# Patient Record
Sex: Female | Born: 1960 | Race: White | Hispanic: No | Marital: Married | Smoking: Never smoker
Health system: Southern US, Community
[De-identification: ages and names within clinical notes are randomized; demographics above are authoritative.]

## PROBLEM LIST (undated history)

## (undated) DIAGNOSIS — I82409 Acute embolism and thrombosis of unspecified deep veins of unspecified lower extremity: Secondary | ICD-10-CM

## (undated) DIAGNOSIS — M8580 Other specified disorders of bone density and structure, unspecified site: Secondary | ICD-10-CM

## (undated) DIAGNOSIS — Z8719 Personal history of other diseases of the digestive system: Secondary | ICD-10-CM

## (undated) DIAGNOSIS — E785 Hyperlipidemia, unspecified: Secondary | ICD-10-CM

## (undated) DIAGNOSIS — D219 Benign neoplasm of connective and other soft tissue, unspecified: Secondary | ICD-10-CM

## (undated) DIAGNOSIS — B977 Papillomavirus as the cause of diseases classified elsewhere: Secondary | ICD-10-CM

## (undated) HISTORY — DX: Hyperlipidemia, unspecified: E78.5

## (undated) HISTORY — DX: Gilbert syndrome: E80.4

## (undated) HISTORY — PX: ABDOMINAL SURGERY: SHX537

## (undated) HISTORY — DX: Benign neoplasm of connective and other soft tissue, unspecified: D21.9

## (undated) HISTORY — DX: Acute embolism and thrombosis of unspecified deep veins of unspecified lower extremity: I82.409

## (undated) HISTORY — DX: Personal history of other diseases of the digestive system: Z87.19

## (undated) HISTORY — DX: Other specified disorders of bone density and structure, unspecified site: M85.80

## (undated) HISTORY — PX: OTHER SURGICAL HISTORY: SHX169

## (undated) HISTORY — PX: ABDOMINAL HYSTERECTOMY: SHX81

## (undated) HISTORY — DX: Papillomavirus as the cause of diseases classified elsewhere: B97.7

---

## 1997-12-08 ENCOUNTER — Encounter (HOSPITAL_COMMUNITY): Admission: RE | Admit: 1997-12-08 | Discharge: 1998-03-08 | Payer: Self-pay | Admitting: Gynecology

## 1998-07-19 ENCOUNTER — Inpatient Hospital Stay (HOSPITAL_COMMUNITY): Admission: AD | Admit: 1998-07-19 | Discharge: 1998-07-23 | Payer: Self-pay | Admitting: Gynecology

## 1998-07-28 ENCOUNTER — Inpatient Hospital Stay (HOSPITAL_COMMUNITY): Admission: AD | Admit: 1998-07-28 | Discharge: 1998-07-28 | Payer: Self-pay | Admitting: Gynecology

## 1998-07-28 ENCOUNTER — Encounter: Payer: Self-pay | Admitting: Gynecology

## 1998-07-30 ENCOUNTER — Ambulatory Visit (HOSPITAL_COMMUNITY): Admission: RE | Admit: 1998-07-30 | Discharge: 1998-07-30 | Payer: Self-pay | Admitting: Gynecology

## 1998-11-23 ENCOUNTER — Ambulatory Visit: Admission: RE | Admit: 1998-11-23 | Discharge: 1998-11-23 | Payer: Self-pay | Admitting: Family Medicine

## 1999-07-30 ENCOUNTER — Other Ambulatory Visit: Admission: RE | Admit: 1999-07-30 | Discharge: 1999-07-30 | Payer: Self-pay | Admitting: Gynecology

## 2000-07-29 ENCOUNTER — Other Ambulatory Visit: Admission: RE | Admit: 2000-07-29 | Discharge: 2000-07-29 | Payer: Self-pay | Admitting: Gynecology

## 2001-05-07 ENCOUNTER — Encounter: Payer: Self-pay | Admitting: Gynecology

## 2001-05-07 ENCOUNTER — Ambulatory Visit (HOSPITAL_COMMUNITY): Admission: RE | Admit: 2001-05-07 | Discharge: 2001-05-07 | Payer: Self-pay | Admitting: Gynecology

## 2001-05-11 ENCOUNTER — Encounter: Payer: Self-pay | Admitting: Gynecology

## 2001-05-11 ENCOUNTER — Encounter: Admission: RE | Admit: 2001-05-11 | Discharge: 2001-05-11 | Payer: Self-pay | Admitting: Gynecology

## 2001-07-30 ENCOUNTER — Other Ambulatory Visit: Admission: RE | Admit: 2001-07-30 | Discharge: 2001-07-30 | Payer: Self-pay | Admitting: Gynecology

## 2001-12-04 ENCOUNTER — Ambulatory Visit (HOSPITAL_COMMUNITY): Admission: RE | Admit: 2001-12-04 | Discharge: 2001-12-04 | Payer: Self-pay | Admitting: Gastroenterology

## 2001-12-07 ENCOUNTER — Ambulatory Visit (HOSPITAL_COMMUNITY): Admission: RE | Admit: 2001-12-07 | Discharge: 2001-12-07 | Payer: Self-pay | Admitting: Gastroenterology

## 2001-12-07 ENCOUNTER — Encounter: Payer: Self-pay | Admitting: Gastroenterology

## 2001-12-18 ENCOUNTER — Encounter: Payer: Self-pay | Admitting: *Deleted

## 2001-12-18 ENCOUNTER — Encounter: Payer: Self-pay | Admitting: Emergency Medicine

## 2001-12-18 ENCOUNTER — Emergency Department (HOSPITAL_COMMUNITY): Admission: EM | Admit: 2001-12-18 | Discharge: 2001-12-18 | Payer: Self-pay | Admitting: Emergency Medicine

## 2002-06-18 ENCOUNTER — Encounter: Payer: Self-pay | Admitting: Gastroenterology

## 2002-06-18 ENCOUNTER — Ambulatory Visit (HOSPITAL_COMMUNITY): Admission: RE | Admit: 2002-06-18 | Discharge: 2002-06-18 | Payer: Self-pay | Admitting: Gastroenterology

## 2002-06-29 ENCOUNTER — Encounter: Payer: Self-pay | Admitting: Gynecology

## 2002-06-29 ENCOUNTER — Encounter: Admission: RE | Admit: 2002-06-29 | Discharge: 2002-06-29 | Payer: Self-pay | Admitting: Gynecology

## 2002-07-30 ENCOUNTER — Other Ambulatory Visit: Admission: RE | Admit: 2002-07-30 | Discharge: 2002-07-30 | Payer: Self-pay | Admitting: Gynecology

## 2003-03-04 ENCOUNTER — Ambulatory Visit (HOSPITAL_COMMUNITY): Admission: RE | Admit: 2003-03-04 | Discharge: 2003-03-04 | Payer: Self-pay | Admitting: Gastroenterology

## 2003-03-04 ENCOUNTER — Encounter: Payer: Self-pay | Admitting: Gastroenterology

## 2003-03-09 ENCOUNTER — Inpatient Hospital Stay (HOSPITAL_COMMUNITY): Admission: EM | Admit: 2003-03-09 | Discharge: 2003-03-16 | Payer: Self-pay | Admitting: Surgery

## 2003-03-09 ENCOUNTER — Encounter: Payer: Self-pay | Admitting: Surgery

## 2003-03-11 ENCOUNTER — Encounter: Payer: Self-pay | Admitting: Surgery

## 2003-03-15 ENCOUNTER — Encounter: Payer: Self-pay | Admitting: Surgery

## 2003-04-19 ENCOUNTER — Ambulatory Visit (HOSPITAL_COMMUNITY): Admission: RE | Admit: 2003-04-19 | Discharge: 2003-04-19 | Payer: Self-pay | Admitting: Surgery

## 2003-04-19 ENCOUNTER — Encounter: Payer: Self-pay | Admitting: Surgery

## 2003-05-06 ENCOUNTER — Inpatient Hospital Stay (HOSPITAL_COMMUNITY): Admission: RE | Admit: 2003-05-06 | Discharge: 2003-05-14 | Payer: Self-pay | Admitting: Surgery

## 2003-05-06 ENCOUNTER — Encounter (INDEPENDENT_AMBULATORY_CARE_PROVIDER_SITE_OTHER): Payer: Self-pay | Admitting: Specialist

## 2003-05-08 ENCOUNTER — Encounter: Payer: Self-pay | Admitting: Surgery

## 2003-08-29 ENCOUNTER — Other Ambulatory Visit: Admission: RE | Admit: 2003-08-29 | Discharge: 2003-08-29 | Payer: Self-pay | Admitting: Gynecology

## 2003-09-02 ENCOUNTER — Encounter: Admission: RE | Admit: 2003-09-02 | Discharge: 2003-09-02 | Payer: Self-pay | Admitting: Gynecology

## 2004-08-31 ENCOUNTER — Other Ambulatory Visit: Admission: RE | Admit: 2004-08-31 | Discharge: 2004-08-31 | Payer: Self-pay | Admitting: Gynecology

## 2004-09-07 ENCOUNTER — Encounter: Admission: RE | Admit: 2004-09-07 | Discharge: 2004-09-07 | Payer: Self-pay | Admitting: Gynecology

## 2005-02-08 ENCOUNTER — Ambulatory Visit (HOSPITAL_COMMUNITY): Admission: RE | Admit: 2005-02-08 | Discharge: 2005-02-08 | Payer: Self-pay | Admitting: Family Medicine

## 2005-09-06 ENCOUNTER — Other Ambulatory Visit: Admission: RE | Admit: 2005-09-06 | Discharge: 2005-09-06 | Payer: Self-pay | Admitting: Gynecology

## 2005-10-04 ENCOUNTER — Ambulatory Visit (HOSPITAL_COMMUNITY): Admission: RE | Admit: 2005-10-04 | Discharge: 2005-10-04 | Payer: Self-pay | Admitting: Gynecology

## 2006-11-19 ENCOUNTER — Ambulatory Visit (HOSPITAL_COMMUNITY): Admission: RE | Admit: 2006-11-19 | Discharge: 2006-11-19 | Payer: Self-pay | Admitting: Gynecology

## 2006-11-28 ENCOUNTER — Other Ambulatory Visit: Admission: RE | Admit: 2006-11-28 | Discharge: 2006-11-28 | Payer: Self-pay | Admitting: Gynecology

## 2006-11-29 DIAGNOSIS — B977 Papillomavirus as the cause of diseases classified elsewhere: Secondary | ICD-10-CM

## 2006-11-29 HISTORY — DX: Papillomavirus as the cause of diseases classified elsewhere: B97.7

## 2007-01-29 HISTORY — PX: OTHER SURGICAL HISTORY: SHX169

## 2007-02-20 ENCOUNTER — Inpatient Hospital Stay (HOSPITAL_COMMUNITY): Admission: RE | Admit: 2007-02-20 | Discharge: 2007-02-22 | Payer: Self-pay | Admitting: Gynecology

## 2007-02-20 ENCOUNTER — Encounter: Payer: Self-pay | Admitting: Gynecology

## 2007-07-24 ENCOUNTER — Other Ambulatory Visit: Admission: RE | Admit: 2007-07-24 | Discharge: 2007-07-24 | Payer: Self-pay | Admitting: Gynecology

## 2007-08-31 ENCOUNTER — Encounter: Admission: RE | Admit: 2007-08-31 | Discharge: 2007-08-31 | Payer: Self-pay | Admitting: Allergy and Immunology

## 2007-11-23 ENCOUNTER — Ambulatory Visit (HOSPITAL_COMMUNITY): Admission: RE | Admit: 2007-11-23 | Discharge: 2007-11-23 | Payer: Self-pay | Admitting: Gynecology

## 2007-12-04 ENCOUNTER — Other Ambulatory Visit: Admission: RE | Admit: 2007-12-04 | Discharge: 2007-12-04 | Payer: Self-pay | Admitting: Gynecology

## 2008-07-08 ENCOUNTER — Other Ambulatory Visit: Admission: RE | Admit: 2008-07-08 | Discharge: 2008-07-08 | Payer: Self-pay | Admitting: Gynecology

## 2008-07-08 ENCOUNTER — Encounter: Payer: Self-pay | Admitting: Women's Health

## 2008-07-08 ENCOUNTER — Ambulatory Visit: Payer: Self-pay | Admitting: Women's Health

## 2008-11-23 ENCOUNTER — Ambulatory Visit (HOSPITAL_COMMUNITY): Admission: RE | Admit: 2008-11-23 | Discharge: 2008-11-23 | Payer: Self-pay | Admitting: Gynecology

## 2008-12-09 ENCOUNTER — Ambulatory Visit: Payer: Self-pay | Admitting: Women's Health

## 2008-12-09 ENCOUNTER — Other Ambulatory Visit: Admission: RE | Admit: 2008-12-09 | Discharge: 2008-12-09 | Payer: Self-pay | Admitting: Gynecology

## 2008-12-09 ENCOUNTER — Encounter: Payer: Self-pay | Admitting: Women's Health

## 2009-08-28 ENCOUNTER — Ambulatory Visit: Payer: Self-pay | Admitting: Vascular Surgery

## 2009-08-28 ENCOUNTER — Encounter (INDEPENDENT_AMBULATORY_CARE_PROVIDER_SITE_OTHER): Payer: Self-pay | Admitting: Emergency Medicine

## 2009-08-28 ENCOUNTER — Emergency Department (HOSPITAL_COMMUNITY): Admission: EM | Admit: 2009-08-28 | Discharge: 2009-08-28 | Payer: Self-pay | Admitting: Emergency Medicine

## 2009-11-24 ENCOUNTER — Encounter: Admission: RE | Admit: 2009-11-24 | Discharge: 2009-11-24 | Payer: Self-pay | Admitting: Gynecology

## 2009-12-22 ENCOUNTER — Ambulatory Visit: Payer: Self-pay | Admitting: Women's Health

## 2009-12-22 ENCOUNTER — Other Ambulatory Visit: Admission: RE | Admit: 2009-12-22 | Discharge: 2009-12-22 | Payer: Self-pay | Admitting: Gynecology

## 2010-02-28 ENCOUNTER — Ambulatory Visit: Payer: Self-pay | Admitting: Women's Health

## 2010-06-01 ENCOUNTER — Ambulatory Visit: Payer: Self-pay | Admitting: Gynecology

## 2010-10-24 ENCOUNTER — Other Ambulatory Visit: Payer: Self-pay | Admitting: Gynecology

## 2010-10-24 DIAGNOSIS — Z1239 Encounter for other screening for malignant neoplasm of breast: Secondary | ICD-10-CM

## 2010-11-26 ENCOUNTER — Ambulatory Visit
Admission: RE | Admit: 2010-11-26 | Discharge: 2010-11-26 | Disposition: A | Discharge status: Home / Self Care | Payer: Managed Care, Other (non HMO) | Source: Ambulatory Visit | Attending: Gynecology | Admitting: Gynecology

## 2010-11-26 DIAGNOSIS — Z1239 Encounter for other screening for malignant neoplasm of breast: Secondary | ICD-10-CM

## 2010-11-28 ENCOUNTER — Ambulatory Visit (INDEPENDENT_AMBULATORY_CARE_PROVIDER_SITE_OTHER): Payer: Managed Care, Other (non HMO) | Admitting: Women's Health

## 2010-11-28 DIAGNOSIS — N390 Urinary tract infection, site not specified: Secondary | ICD-10-CM

## 2010-11-28 DIAGNOSIS — N898 Other specified noninflammatory disorders of vagina: Secondary | ICD-10-CM

## 2010-11-28 DIAGNOSIS — R82998 Other abnormal findings in urine: Secondary | ICD-10-CM

## 2010-11-28 DIAGNOSIS — B373 Candidiasis of vulva and vagina: Secondary | ICD-10-CM

## 2010-12-28 ENCOUNTER — Encounter: Payer: Self-pay | Admitting: Women's Health

## 2011-01-02 LAB — BASIC METABOLIC PANEL
CO2: 29 mEq/L (ref 19–32)
Calcium: 9.6 mg/dL (ref 8.4–10.5)
Creatinine, Ser: 0.74 mg/dL (ref 0.4–1.2)
GFR calc Af Amer: >60 mL/min (ref >60)
GFR calc non Af Amer: >60 mL/min (ref >60)

## 2011-01-02 LAB — D-DIMER, QUANTITATIVE: D-Dimer, Quant: <0.22 ug/mL-FEU (ref 0.00–0.48)

## 2011-01-14 ENCOUNTER — Other Ambulatory Visit: Payer: Self-pay | Admitting: Women's Health

## 2011-01-14 ENCOUNTER — Encounter (INDEPENDENT_AMBULATORY_CARE_PROVIDER_SITE_OTHER): Payer: Managed Care, Other (non HMO) | Admitting: Women's Health

## 2011-01-14 ENCOUNTER — Other Ambulatory Visit (HOSPITAL_COMMUNITY)
Admission: RE | Admit: 2011-01-14 | Discharge: 2011-01-14 | Disposition: A | Discharge status: Home / Self Care | Payer: Managed Care, Other (non HMO) | Source: Ambulatory Visit | Attending: Gynecology | Admitting: Gynecology

## 2011-01-14 DIAGNOSIS — B373 Candidiasis of vulva and vagina: Secondary | ICD-10-CM

## 2011-01-14 DIAGNOSIS — Z124 Encounter for screening for malignant neoplasm of cervix: Secondary | ICD-10-CM | POA: Insufficient documentation

## 2011-01-14 DIAGNOSIS — Z01419 Encounter for gynecological examination (general) (routine) without abnormal findings: Secondary | ICD-10-CM

## 2011-02-15 NOTE — Consult Note (Signed)
[facility]  Patient:    Kelsey [name], Kelsey [name] Visit Number: [id number] MRN: [id number]          Service Type: EMS Location: ED Attending Physician:  Benny Lennert Dictated by:   Sabino Gasser, M.D. Admit Date:  12/18/2001 Discharge Date: 12/18/2001   CC:         Anselmo Rod, M.D.  Douglass Rivers, M.D.  Tina L. Gatewood, P.A.   Consultation Report  HISTORY OF PRESENT ILLNESS:  Ms. Rodak is a 50 year old female patient of Dr. Loreta Ave, called me at 6 a.m. and was referred to the emergency room where I saw her.  She presents with acute onset of abdominal pain and nausea and vomiting.  She has had a number of episodes that started on January 17 and persisted until today.  The patient states that she was feeling well for the last few weeks when she states that last night she had pasta with red sauce and salad around 7 p.m. and around 45 minutes later she developed an upper abdominal discomfort, felt like a fullness, aching, which it usually starts out that way and then becomes a burning pain in the upper abdominal followed by a cramping-type of pain.  At approximately 1 a.m., she vomited up the salad material but not the pasta material and subsequently vomited three more times and each time it was bilious.  She denies any hematemesis or black vomitus. Her bowel movements have been normal once a day, formed brown stool.  No blood, no black stool seen.  In fact, her last bowel movement was last evening when in the midst of the vomiting episodes.  Vomiting does not seen to make her pain any better and she has had a number of episodes like this in the past.  Sometimes, the pain lasts for a few days or a week.  It may be just a discomfort but at times, when it gets worse, there is a burning feeling as well.  She has not been given any medications by Dr. Loreta Ave but has been given medications by Dr. Farrel Gobble, who is her gynecologist, but she did not bring  the medications with her and she is not sure what that was.  She states that prior to January she had never had any GI complaints.  She does not smoke, does not drink alcohol, and is taking no medications at this point, other than as described above, which she is not sure the name of the medications. Typically, these attacks occur usually in the evening.  She has had one episode to awake her from sleep at 6 a.m. and, with some of the episodes, she has had vomiting.  FAMILY HISTORY:  Notable for gallbladder disease in a number of the female relatives.  PAST MEDICAL HISTORY:  Notable for having had fibroids, fibroidectomy, cesarean section, and subsequently a hysterectomy--subtotal.  The ovaries have been left in place.  She denies abdominal distention, fever, chills, weight loss, blood per vomitus or per rectum, and denies history of cardiac/pulmonary disorders.  Apparently, the patient and her husband have debated whether in fact she had history of frequent urinary tract infections, but she denies dysuria or any urinary symptoms at this time.  Her hysterectomy was in 1999. Recent endoscopy and colonoscopy by Dr. Loreta Ave were negative.  Apparently, she had Hemoccult-positive stools in Dr. Trilby Drummer office.  Additionally, a HIDA scan was negative; however, the patient was not having abdominal pain at that time. 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 Loreta Ave.  PHYSICAL EXAMINATION:  VITAL SIGNS:  On examination now, she is afebrile.  Vital signs are stable.  GENERAL:  She is a well-developed, well-nourished female.  Appears actually in no distress, although at times describes having a cramping pain, for which she feels fairly uncomfortable.  HEENT:  Without evidence of jaundice.  NECK:  Thyroid is not enlarged.  No bruits are heard in the neck.  LUNGS:   Clear.  HEART:  Regular rhythm with pulses 60 without murmur, gallop, or rub appreciated.  ABDOMEN:  Bowel sounds are normal.  Tenderness is noted in the lower midline but nowhere else.  The discomfort, however, seems to be felt in the epigastrium above the umbilicus but tenderness is all below the umbilicus in the midline.  EXTREMITIES:  Normal.  NEUROLOGIC:  Normal.  RECTAL:  Exam deferred with recent colonoscopy done.  LABORATORY DATA:  At this time, laboratory studies were obtained.  Hematocrit at 46 may reflect some degree of dehydration.  Urine showed 0-2 white blood cells and trace ketones.  Amylase 122, lipase 199; both normal.  Bilirubin is 2.3.  Alkaline phosphatase and transaminases, total protein, albumin are all normal.  CBC:  White count 10,200, hemoglobin 16, hematocrit 46.5; as above, probably reflects some degree of dehydration from the vomiting.  Neutrophils are 90%, lymphocytes 6%.  Ultrasound showed that the gallbladder was well visualized without evidence of stones.  Gallbladder wall is normal.  The bowel duct is normal.  Liver, pancreas, and spleen are unremarkable.  Minimal amount of free fluid, however, surrounding the liver and the spleen and ultrasound evaluation of the pelvis reveals no definite fluid.  ASSESSMENT:  Abdominal pain in the lower midline, etiology of which is not clear at this point, despite evaluation, as described above.  PLAN:  CT scan of the abdomen and pelvis and decide on further course of therapy, depending on findings in the patients clinical course. Dictated by:   Sabino Gasser, M.D. Attending Physician:  Benny Lennert DD:  12/18/01 TD:  12/21/01 Job: [id number]

## 2011-02-15 NOTE — Procedures (Signed)
[facility]  Patient:    Kelsey [name], Kelsey [name] Visit Number: [id number] MRN: [id number]          Service Type: Attending:  Anselmo Rod, M.D. Dictated by:   Anselmo Rod, M.D. Proc. Date: 12/04/01   CC:         Douglass Rivers, M.D.  Tina L. Gatewood, P.A.   Procedure Report  DATE OF BIRTH:  1961/07/14.  REFERRING PHYSICIAN:  Douglass Rivers, M.D.  PROCEDURE PERFORMED:  Colonoscopy.  ENDOSCOPIST:  Anselmo Rod, M.D.  INSTRUMENT USED:  Olympus pediatric colonoscope.  INDICATIONS FOR PROCEDURE:  Guaiac positive stool in a 50 year old white female, rule out colonic polyps, masses, hemorrhoids, etc.  PREPROCEDURE PREPARATION:  Informed consent was procured from the patient. The patient was fasted for eight hours prior to the procedure and prepped with a bottle of magnesium citrate and a gallon of NuLytely the night prior to the procedure.  PREPROCEDURE PHYSICAL:  The patient had stable vital signs.  Neck supple. Chest clear to auscultation.  S1, S2 regular.  Abdomen soft with normal bowel sounds.  DESCRIPTION OF PROCEDURE:  The patient was placed in the left lateral decubitus position and sedated with an additional 50 mg of Demerol and 5 mg of Versed intravenously.  Once the patient was adequately sedated and maintained on low-flow oxygen and continuous cardiac monitoring, the Olympus video colonoscope was advanced from the rectum to the cecum without difficulty. Except for small internal hemorrhoids and a few left-sided diverticula, no other abnormalities were noticed.  The appendicular orifice and ileocecal valve were clearly visualized and photographed.  IMPRESSION:  Normal-appearing colon except for left-sided diverticulosis and small internal hemorrhoids.  No masses or polyps seen.  RECOMMENDATIONS: 1. A high fiber diet has been recommended for the patient with liberal fluid    intake. 2. Proceed with HIDA scan as  scheduled. 3. Outpatient follow-up in the next two weeks.  Repeat guaiacs will be done at    that time. Dictated by:   Anselmo Rod, M.D. Attending:  Anselmo Rod, M.D. DD:  12/04/01 TD:  12/07/01 Job: [id number]

## 2011-02-15 NOTE — Op Note (Signed)
NAMEBRITNAY, Kelsey [name]                          ACCOUNT NO.:  [id number]   MEDICAL RECORD NO.:  [id number]                   PATIENT TYPE:  INP   LOCATION:  0482                                 FACILITY:  [facility]   PHYSICIAN:  Abigail Miyamoto, M.D.              DATE OF BIRTH:  July 09, 1961   DATE OF PROCEDURE:  05/06/2003  DATE OF DISCHARGE:                                 OPERATIVE REPORT   PREOPERATIVE DIAGNOSIS:  Sigmoid diverticulitis.   POSTOPERATIVE DIAGNOSIS:  Sigmoid diverticulitis.   OPERATION/PROCEDURE:  1. Sigmoid colectomy.  2. Incidental appendectomy.   SURGEON:  Abigail Miyamoto, M.D.   ASSISTANT:  Lebron Conners, M.D.   ANESTHESIA:  General endotracheal anesthesia.   ESTIMATED BLOOD LOSS:  Minimal.   FINDINGS:  The patient was found to have a large cystic fluid collection  adjacent to the sigmoid colon which may represent a sterile abscess.   DESCRIPTION OF PROCEDURE:  The patient was brought to the operating room and  was identified as Kelsey [name].  She was placed supine on the operating  room table and general anesthesia was induced. Her abdomen was appropriately  draped in the usual sterile fashion.   Using a #10 blade, a lower midline incision was then created.  Incision was  carried down through the fascia with electrocautery.  The peritoneum was  then opened the entire length of the incision.  Several adhesions of the  small bowel to the abdominal wall were taken down with the electrocautery as  well as the Metzenbaum scissors.  The patient was found to have a floppy  peritoneum down the pelvis from her previous hysterectomy.  Further  examination revealed a large cystic filling mass down in the pelvis which  was felt to represent sterile abscess.  The sigmoid colon proximal to this  was decompressed.  Adherent sigmoid colon was identified proximal to this.   First our attention was turned to the appendectomy.  The mesoappendix was  taken down with  Kelly clamps 2-0 silk ties.  The mesoappendix was taken down  with Kelly clamps and 2-0 silk ties.  The appendix was then transected at  its base with a GIA-55 stapler.  The patient was found to have a large  fecalith at the base of the appendix.  The appendix was sent to pathology  for identification.   Next, the sigmoid colon was transected with GIA-55 stapler proximal to the  area of inflammation and cystic mass.  Finger fracturing was used to free  the cystic mass as well as mobilization along the white line of Toldt with  the electrocautery.  The colon mesentery was taken down likewise with clamps  and 2-0 silk ties as well, moving down to the pelvis around the cystic mass.  Further finger fracturing elevated the mass which was stuck to the sigmoid  colon up out of the pelvis.  An area of sigmoid colon  was then identified  distal to this and transected with the GIA-55 stapler as well.  The rest of  the mesentery was taken out with clamps and silk ties.  The cyst ruptured  somewhat and serosanguineous fluid was found in the cyst.  It did not have  any odor, did not have any appearance of purulence. The abdomen was then  irrigated with several liters of normal saline.   Next, a side-to-side anastomosis was performed from colon to colon by first  reapproximating the segments of proximal and distal colon with interrupted  silk sutures and then performing colotomies and finally performed the  anastomosis with a single firing of the GIA-55 stapler.  Large anastomosis  appeared to be created.  Silk suture was used to stop a bleeding point on  the suture line.  The open end was then closed with interrupted 3-0 silk  sutures.  Mesentery defect was likewise closed with silk sutures.  The  anastomosis was examined and found to be widely patent and appeared viable  and floppy.  The abdomen was again irrigated with several liters of normal  saline.  The pelvis was examined and there were several  areas of mild ooze  where the cystic mass had to be finger fractured up out of the pelvis.   Hemostasis was achieved with electrocautery as well as a piece of Surgicel.  Again the anastomosis was examined and was found to be intact and viable.  The midline fracture was then closed with a running #1 PDS suture.  The skin  was then irrigated and closed with skin staples.  The patient tolerated the  procedure well.  All sponge, needle and instrument counts were correct at  the end of the procedure.  The patient was then extubated in the operating  room and taken in stable condition to the recovery room.                                               Abigail Miyamoto, M.D.    DB/MEDQ  D:  05/06/2003  T:  05/06/2003  Job:  [id number]

## 2011-02-15 NOTE — Discharge Summary (Signed)
   NAMELISSETH, BRAZEAU                          ACCOUNT NO.:  [id number]   MEDICAL RECORD NO.:  [id number]                   PATIENT TYPE:  INP   LOCATION:  5730                                 FACILITY:  [facility]   PHYSICIAN:  Abigail Miyamoto, M.D.              DATE OF BIRTH:  June 17, 1961   DATE OF ADMISSION:  03/10/2003  DATE OF DISCHARGE:  03/16/2003                                 DISCHARGE SUMMARY   SUMMARY:  Kelsey [name] is 50 year old female with known diverticulitis who  has been failing outpatient oral antibiotic therapy.  Therefore the decision  was made to admit the patient for IV antibiotics.   HOSPITAL COURSE:  The patient was admitted and placed on IV Zosyn and a CAT  scan of the abdomen and pelvis was performed.  The CAT scan showed the  patient to have a pelvic abscess secondary to the diverticulitis.  At this point interventional radiology was consulted and the patient  underwent a transrectal abscess drainage after being transferred from [facility] to [facility].  The abscess was successfully drained and the  drain was left in place.  Postoperatively the patient's abdomen was soft.  Over the next several days she was continued on IV antibiotics with the  drain in place.  She was having normal bowel movements.  On March 15, 2003 a  CAT scan was repeated which shows she has the main abscess drainage but some  other smaller abscesses are present.  At this point the drain was removed  and she was continued on IV antibiotics.  By March 16, 2003 she was doing  well, she remained afebrile, she was tolerating a regular diet and her  abdomen was soft and nontender.  The decision was made to discharge the  patient to home on oral antibiotics.   DISCHARGE DIAGNOSIS:  Diverticulitis with abscess status post percutaneous  drainage.   MEDICATIONS:  1. Augmentin 375 mg b.i.d. for 10 days.  2. She will resume her home medications.   She has no dietary restrictions and  she may shower.  She will follow up in  my office in two weeks.                                               Abigail Miyamoto, M.D.    DB/MEDQ  D:  03/28/2003  T:  03/29/2003  Job:  [id number]   cc:   Anselmo Rod, M.D.  [address]  [address]  [place]  [place] [address]  Fax: [phone]    cc:   Anselmo Rod, M.D.  [address]  [address]  [place]  [place] [address]  Fax: [phone]

## 2011-02-15 NOTE — Discharge Summary (Signed)
Kelsey [name], Kelsey [name]                ACCOUNT NO.:  [id number]   MEDICAL RECORD NO.:  [id number]          PATIENT TYPE:  INP   LOCATION:  9309                          FACILITY:  [facility]   PHYSICIAN:  Juan H. Lily Peer, M.D.DATE OF BIRTH:  06-16-1961   DATE OF ADMISSION:  02/20/2007  DATE OF DISCHARGE:  02/22/2007                               DISCHARGE SUMMARY   HISTORY OF PRESENT ILLNESS:  The patient is a 50 year old gravida 10  para 1, abortus 9 who was taken to the operating room on the morning of  May 23 secondary to pelvic pain and left adnexal mass, right ovarian  cyst, and history of vaginal intraepithelial neoplasia I.   HOSPITAL COURSE:  With the assistance of Dr. Jamey Ripa, the patient  underwent an extensive abdominal pelvic adhesiolysis secondary to her  multitude of abdominal pelvic surgeries in the past.  She also underwent  a left salpingectomy as well as drainage of her right simple ovarian  cyst and the CO2 laser the vaginal cuff where she had VIN-I. The patient  had a history of DVT in the past so she received Lovenox 40 mg subcu 12  hours before the surgery and was continued on it postoperatively and for  30 days thereafter.  The patient also had PSA stockings as well for  prevention. She received a gram of cefoxitin IV and intraoperatively she  did well whereby she only had 50 mL blood loss. Postoperatively she had  an On-Q catheter  underneath her incision site for postoperative  analgesia consisting of 0.5% lidocaine at 4 mL/hour which help the  patient become more ambulatory much quicker and require less of the PCA  pump.  In a 24-hour period she used 1.4 mg of Dilaudid and did not  require mitral oral pain medication. She was afebrile.  Her  hemoglobin/hematocrit 11.9 and 33.9 respectively with a platelet count  125,000 which was expected to be a transitory low secondary to her  Lovenox.  The patient was up ambulating, was placed on clear liquid diet  and the evening  of her postoperative day starting her second day she  felt a little lightheaded and her blood pressure dropped to 78/46 with  pulse of 57.  She was given a fluid bolus of 500 mL of lactated Ringer's  and kept 125 mL/hour and no recurrence in her blood pressure was back to  normal at 110/64 with pulse of 73. Second postoperative day she was up  and ambulating now was passing gas. Abdomen was soft, positive bowel  sounds.  Incision was intact.  Slight drainage clear fluid from the  catheter site otherwise doing well.  She was up and ambulating and she  was advanced to a regular diet.  She was continued on her Lovenox 40 mg  subcu every 24 hours.  She was ready to be discharged home.   FINAL DIAGNOSIS:  1. Pelvic pain.  2. Abdominal pelvic adhesions.  3. Left hydrosalpinx.  4. VIN-I.  5. Right ovarian cyst.   PROCEDURE PERFORMED:  1. Exploratory laparotomy.  2. Extensive abdominal pelvic adhesiolysis.  3. Left salpingectomy.  4. Drainage of right simple ovarian cyst.  5. CO2 laser vagina.   FINAL DISPOSITION/FOLLOW UP:  The patient was ready to be discharged  home after second postoperative day. DVT prophylaxis had been undertaken  to the fact that the patient had a history of DVT in the past.  She  received Lovenox 40 mg subcu at 12 hours before surgery and was  continued q.24 h postoperatively and will continue to do so at home for  four more weeks.  The patient also had PSA stockings during her  hospitalization as well for DVT prophylaxis.  The patient will return to  the office next week after week after surgery to remove her midline  staples.  She will self remove her On-Q pain medication catheter  tomorrow after 72 hours and she was given a prescription of Lortab  7.5/500 take one p.o. q.4-6 h p.r.n. pain.      Juan H. Lily Peer, M.D.  Electronically Signed     JHF/MEDQ  D:  02/22/2007  T:  02/22/2007  Job:  [id number]

## 2011-02-15 NOTE — Discharge Summary (Signed)
NAMEESPERANSA, [name]                          ACCOUNT NO.:  [id number]   MEDICAL RECORD NO.:  [id number]                   PATIENT TYPE:  INP   LOCATION:  0482                                 [facility]ACILITY[facility]  [facility]   PHYSICIAN:  Abigail Miyamoto, M.D.              DATE OF BIRTH:  10/13/1960   DATE OF ADMISSION:  05/06/2003  DATE OF DISCHARGE:  05/14/2003                                 DISCHARGE SUMMARY   DISCHARGE DIAGNOSES:  1. Diverticulitis of the colon with abscess, status post elective sigmoid     colon resection.  2. Benign tumor of the ovary.  3. Nausea.   SUMMARY OF HISTORY:  Kelsey [name] is a 50 year old female with recent  history of a diverticular abscess who now presents for elective sigmoid  colectomy and appendectomy.   HOSPITAL COURSE:  The patient was admitted on May 06, 2003 where she was  taken to the operating room and underwent a sigmoid colectomy and  appendectomy.  She was found to have a large mass and cystic fluid  collection base of the colon which was felt to be a sterile abscess and  this, indeed, ended up containing her left ovary.  She tolerated procedure  well and was taken in stable condition to a regular surgical floor.  Postoperative day one she did have some nausea and vomited once during the  night, but otherwise was comfortable.  She began ambulating on postoperative  day one.  By postoperative day two was feeling somewhat better.  She did  have some shoulder pain which ended up being unremarkable.  A chest x-ray  was unremarkable at this time as well as an EKG.  Her Foley catheter was  removed and by postoperative day three she was having no nausea and was  tolerating sips of liquids.  She had good bowel sounds.  Her incision was  healing well.  She was continued on IV antibiotics at this time.  By  postoperative day four she was continuing to do well.  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  Reglan was added for nausea.  By  postoperative day five she was having some flatus and having better bowel  sounds.  She was kept on liquids, however.  By the next postoperative day  she was having multiple loose bowel movements.  Her diet was advanced  further.  Continued to remain soft.  Over the next two days she continued to  improve greatly.  Her staples were removed.  She began tolerating a regular  diet and having normal bowel movements.  Decision was made to discharge  patient to home.   DISCHARGE MEDICATIONS:  She will take Percocet and ibuprofen for pain.  She  will resume any home medications she was on.   ACTIVITY:  She is to do no heavy lifting greater than 20  pounds for five  weeks.   DIET:  She has no restrictions.   WOUND CARE:  She may shower.  She will leave her Steri-Strips in place.   FOLLOWUP:  She will follow up in the office of Dr. __________ in one to two  weeks.                                               Abigail Miyamoto, M.D.   DB/MEDQ  D:  06/02/2003  T:  06/02/2003  Job:  [id number]

## 2011-02-15 NOTE — H&P (Signed)
Kelsey [name], [name]               ACCOUNT NO.:  [id number]   MEDICAL RECORD NO.:  [id number]          PATIENT TYPE:  INP   LOCATION:                                FACILITY:  [facility]   PHYSICIAN:  Juan H. Lily Peer, M.D.DATE OF BIRTH:  1961/07/12   DATE OF ADMISSION:  02/20/2007  DATE OF DISCHARGE:  02/22/2007                              HISTORY & PHYSICAL   CHIEF COMPLAINT:  1. Pelvic pain.  2. Right ovarian cyst.  3. Left adnexal mass.  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   HISTORY:  The patient is a gravida 10, para 1, AB 9 who was seen in the  office for a preoperative consultation on Feb 17, 2007.  She is  scheduled to undergo an exploratory laparotomy with lysis of  abdominopelvic adhesions as well as right ovarian cystectomy and left  salpingectomy with possible left salpingo-oophorectomy.  The patient had  been complaining of lower abdominal discomfort especially when she  twists or turns and had an ultrasound on January 26, 2007 in the office  which was compared with a previous scan dated December 04, 2006 which  demonstrated that the patient has a right ovary with a thin-walled cyst  that measured 37 x 31 x 19 mm but also had some solid echogenic focus in  the superior wall of the cyst, and it was measuring 8 x 7 mm.  It was  not seen on the previous scan, although positive color flow Doppler  documented the lateral edge of this mass.  The ovary was not seen, but a  tortuous left adnexa, thin-walled cystic area measuring 6.9 x 3.1 x 1.5  cm with negative color flow Doppler, and tiny excrescences in the area  of the wall of the mass were noted, possibly a hydrosalpinx.  The  patient has had a history in the past of carcinoma in situ of the cervix  and had a hysterectomy in the past.  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   Interestingly, her past history consists of a multitude of operations.  She had had a myomectomy in the past as well as a C-section and also had  a TAH for dysfunctional uterine bleeding.  That operation resulted in an  incidental enterotomy which required repair back in 1994.  She had had  history of sigmoid colectomy in 2004 secondary to a diverticular abscess  and had an appendectomy at the same time.  It is also reported that she  also had a left ovarian cystadenoma removed as well.  Also at the time  of one of her multitude of D&Cs in the past, she had developed deep  venous thrombosis, and a workup demonstrated that she was positive for  the anticardiolipin antibodies.  Also in the past she had been diagnosed  with Sullivan Lone syndrome.   Review of the patient's medical record indicated from her previous  surgery that she had extensive abdominopelvic adhesions, and the  ovaries  had been adherent to the pelvic sidewall.  With all of the above  multitude of operations that the patient has undergone, it was decided  to consult a general surgeon, and Dr. Jamey Ripa will be present during the  exploratory laparotomy until we gain entrance safely into the abdominal  cavity before we proceed then with the gynecological plan of action  which would be to remove the right tube and an ovarian cystectomy and  possible removal of the left tube and ovary.   ALLERGIES:  THE PATIENT DENIES ANY ALLERGIES.   PAST MEDICAL HISTORY:  1. Multiple D&Cs.  2. Myomectomy in the past.  3. Cesarean section.  4. Total abdominal hysterectomy with incidental enterotomy.  5. Resection of a portion of the sigmoid colon as a result of ruptured      diverticular abscess in 2004.  6. History of anticardiolipin antibodies.  7. Sullivan Lone syndrome.   FAMILY HISTORY:  Maternal grandmother with diabetes.  Maternal aunt with  history of breast cancer.   PHYSICAL EXAMINATION:  VITAL SIGNS:  The patient weighs 136 pounds.   She  is 5 feet 2.5 inches tall.  Blood pressure 122/80.  HEENT:  Unremarkable.  NECK:  Supple.  Trachea midline.  No carotid bruits, no thyromegaly.  LUNGS:  Clear to auscultation without rhonchi or wheezes.  HEART:  Regular rate and rhythm.  No murmurs.  BREASTS:  Exam not done.  ABDOMEN:  Soft, nontender.  No rebound or guarding.  PELVIC:  Bartholin, urethral, and Skene's glands within normal limits.  Vaginal with no gross visual lesions noted but on colposcopic-directed  biopsy, the patient was noted to have VAIN 1 at the center aspect of the  vaginal cuff.  RECTAL:  Unremarkable.   ASSESSMENT:  50 year old gravida 10, para 1, abortion 9 with chronic  pelvic pain with a tortuous 7-cm left adnexal mass, possibly  hydrosalpinx and a 3.5-cm right ovarian cyst.  The patient will undergo  exploratory laparotomy due to the fact that she has had a multitude of  abdominopelvic surgeries, see note above.  I will plan on proceeding  with a left salpingectomy, possible left salpingo-oophorectomy, with  possible right ovarian cystectomy.  Because of the extensive nature of  all of the operations and complications that she had in the past, a  general surgeon will be present during the exploratory process in the  operating room before we proceed with the gynecological portion of the  operation.  We will make every effort to preserve as much ovarian tissue  as we had discussed in the preoperative consultation.  Due to her  history of deep venous thrombosis in the past, she will receive Lovenox  two hours before her procedure and then daily for one to two weeks  postoperatively.  She will also undergo a bowel prep the day before her  surgery.  After the abdominal portion is completed, we will proceed with  CO2 laser ablation of the vaginal cuff and vaginal Intra-epithelial  neoplasia 1 at the center aspect of the vaginal cuff that was described in the findings on colposcopic-directed biopsy.  We  went through a  detailed discussion about the potential risks of the operation to  include infection, bleeding, trauma to internal organs, and the need for  diverting colostomy, possible additional bowel resection, risk of injury  to other internal organs, and also potential risks from the laser  surgery as well.  I discussed the case of her Sullivan Lone syndrome with Dr.  Charna Elizabeth because of the patient's slight elevation of her  preoperative labs which indicated that her total bilirubin was 2.0,  upper limits of normal being 1.2.  At the time of this dictation, we are  waiting to see if most of the bilirubin is unconjugated, then the  patient should have no problem proceeding with the surgery according to  the gastroenterologist.  All of these issues were discussed with the  patient, and all questions were answered.  We will follow accordingly.   PLAN:  The patient is scheduled for exploratory laparotomy with lysis of  abdominopelvic adhesions along with right ovarian cystectomy and left  salpingectomy with possible left salpingo-oophorectomy on Friday, Feb 20, 2007 at 7:30 a.m. at [facility].  Please have history and  physical available.      Juan H. Lily Peer, M.D.  Electronically Signed     JHF/MEDQ  D:  02/19/2007  T:  02/19/2007  Job:  [id number]

## 2011-02-15 NOTE — H&P (Signed)
NAMEGINNI, Kelsey [name]                          ACCOUNT NO.:  [id number]   MEDICAL RECORD NO.:  [id number]                   PATIENT TYPE:  INP   LOCATION:  5730                                 FACILITY:  [facility]   PHYSICIAN:  Abigail Miyamoto, M.D.              DATE OF BIRTH:  1960-12-01   DATE OF ADMISSION:  03/09/2003  DATE OF DISCHARGE:  03/16/2003                                HISTORY & PHYSICAL   CHIEF COMPLAINT:  Abdominal pain.   HISTORY:  This is a 50 year old female who has a long history of abdominal  pain.  She most recently had an acute episode of crampy abdominal pain and  fever so she presented to gastroenterologist who performed a CATs scan  showing her to have some diverticulitis.  She was placed on oral antibiotics  several days ago, but secondary to her failure to improve she was sent to my  office for further evaluation.   Upon evaluation in the office her abdomen was found to be soft and there was  tenderness and bloating in the left lower quadrant. Therefore, decision was  made to admit the patient for IV antibiotics. The patient was otherwise  without complaint.  She reports she was having some mild constipation, but  no real fevers although she is having some night sweats.  She was otherwise  without complaints.   PAST MEDICAL HISTORY:  Significant for the sigmoid diverticulosis. She has  had a long medical workup of her abdominal problems.   PAST SURGICAL HISTORY:  Includes a total abdominal hysterectomy and  fibroidectomy.   MEDICATIONS AT THIS TIME:  Cipro and Flagyl.   SOCIAL HISTORY:  She is married.  She has 1 adopted child. 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   REVIEW OF SYSTEMS:  Otherwise unremarkable.   ALLERGIES:  She has no known drug allergies.   PHYSICAL EXAMINATION:  GENERAL:  Reveals a well-developed, well-nourished  female in no acute distress.  Generally she is well appearing.  VITAL SIGNS:  Temperature is 98.7, pulse is 81, blood pressure is 106/62,  respiratory rate is 20.  HEENT:  Eyes she is anicteric.  Her pupils are reactive.  NECK:  Neck is supple.  There are no masses.  There is no thyromegaly.  LUNGS:  Clear to auscultation bilaterally with normal effort.  CARDIOVASCULAR:  Regular rate and rhythm but no peripheral edema.  ABDOMEN:  Soft.  There is no organomegaly.  There are no hernias. The  patient does have tenderness and fullness in the left lower quadrant.  EXTREMITIES:  Warm and well perfused.    ASSESSMENT/PLAN:  This is a 50 year old female with diverticulitis who  appears to not be improving with oral outpatient antibiotics.  At this point  we will start her on IV antibiotics. We need a CAT scan of the abdomen and  pelvis to rule  out a diverticular abscess.                                               Abigail Miyamoto, M.D.    DB/MEDQ  D:  03/28/2003  T:  03/28/2003  Job:  [id number]

## 2011-02-15 NOTE — Op Note (Signed)
[facility]  Patient:    Kelsey [name], Kelsey [name] Visit Number: [id number] MRN: [id number]          Service Type: Attending:  Anselmo Rod, M.D. Dictated by:   Anselmo Rod, M.D. Proc. Date: 12/04/01   CC:         Douglass Rivers, M.D.  Tina L. Gatewood, P.A.   Operative Report  DATE OF BIRTH:  May 27, 1961  REFERRING PHYSICIAN:  Douglass Rivers, M.D.  PROCEDURE PERFORMED:  Esophagogastroduodenoscopy.  ENDOSCOPIST:  Anselmo Rod, M.D.  INSTRUMENT USED:  Olympus video panendoscope.  INDICATIONS FOR PROCEDURE:  Epigastric pain and guaiac positive stool in a 50 year old white female rule out peptic ulcer disease, esophagitis, gastritis, etc.  PREPROCEDURE PREPARATION:  Informed consent was procured from the patient. The patient was fasted for eight hours prior to the procedure.  PREPROCEDURE PHYSICAL:  The patient had stable vital signs.  Neck supple. Chest clear to auscultation.  S1, S2 regular.  Abdomen soft with normal abdominal bowel sounds.  DESCRIPTION OF PROCEDURE:  The patient was placed in left lateral decubitus position and sedated with 50 mg of Demerol and 5 mg of Versed intravenously. Once the patient was adequately sedated and maintained on low-flow oxygen and continuous cardiac monitoring, the Olympus video panendoscope was advanced through the mouthpiece, over the tongue, into the esophagus under direct vision.  The entire esophagus appeared normal.  The entire gastric mucosa and the proximal small bowel appeared normal as well.  No erosions, ulcerations, masses or polyps were seen.  There was no evidence of outlet obstruction.  IMPRESSION:  Normal esophagogastroduodenoscopy.  RECOMMENDATION:  Proceed with colonoscopy at this time.  Further recommendations to be made thereafter. Dictated by:   Anselmo Rod, M.D. Attending:  Anselmo Rod, M.D. DD:  12/04/01 TD:  12/07/01 Job: [id number]

## 2011-02-15 NOTE — Op Note (Signed)
Kelsey [name], Kelsey [name]                ACCOUNT NO.:  [id number]   MEDICAL RECORD NO.:  [id number]          PATIENT TYPE:  INP   LOCATION:  9309                          FACILITY:  [facility]   PHYSICIAN:  Juan H. Lily Peer, M.D.DATE OF BIRTH:  1961-06-02   DATE OF PROCEDURE:  02/20/2007  DATE OF DISCHARGE:                               OPERATIVE REPORT   SURGEON:  Juan H. Lily Peer, M.D.   CO-SURGEON:  Cicero Duck, M.D.   INDICATIONS FOR OPERATION:  A 50 year old gravida 10, para 1, AB 9 who  was taken to the operating room for an exploratory laparotomy with  planned abdominal and pelvic adhesions as well as right ovarian  cystectomy and possible left salpingectomy, possible left salpingo-  oophorectomy, secondary to low abdominal pains and left adnexal cystic  mass measuring 6.9 x 3.1 x 1.5 cm and a right thin-wall cyst measuring  3.7 x 3.1 x 1.9 cm.  Patient with prior history of DVT in the past, had  received Lovenox 30 mg subcutaneously 12 hours before surgery.   PREOPERATIVE DIAGNOSES:  Cyst.   POSTOPERATIVE DIAGNOSIS:  Cyst, along with extensive abdominal and  pelvic adhesions.   PROCEDURE PERFORMED:  1. Abdominal and pelvic adhesiolysis.  2. Left salpingectomy.  3. Drainage of right simple ovarian cyst.   FINDINGS:  Patient with extensive abdominal and pelvic adhesions and a  sausage-shaped mass that appeared to be a hydrosalpinx measuring  approximately 6 x 3 x 1.1 cm on the left adnexa, and the right ovary had  one small cyst measuring 3.7 x 3.1 x 1.9 cm, clear in appearance also.  Patient with colposcopic directed biopsy had demonstrated she had VIN-1  of the vaginal cuff.   PREOPERATIVE DIAGNOSES:  1. Pelvic pain.  2. Left adnexal mass.  3. Right ovarian cyst.  4. VIN-1.   POSTOPERATIVE DIAGNOSES:  1. Pelvic pain.  2. Left adnexal mass.  3. Right ovarian cyst.  4. VIN-1.   PROCEDURE PERFORMED:  1. Extensive abdominal and pelvic adhesiolysis.  2. Left  salpingectomy.  3. Drainage of right simple ovarian cyst.  4. CO2 laser of vaginal dysplasia.   FINDINGS:  Patient with extensive abdominal and pelvic adhesions and a  hydrosalpinx equivalent to the measurements given above and a very small  right ovarian cyst.  There was no excrescences on either ovary.   DESCRIPTION OF OPERATION:  After the patient was adequately counseled,  she was taken to the operating room.  Prior to this, the patient had  received Lovenox 40 mg subcu 12 hours before her surgery, and in the  hospital, she received a gram of cefoxitin and had PSA stockings before  she was taken to the operating room.  In the operative room, she  underwent a successful general endotracheal anesthesia.  The abdomen,  vagina and perineum were prepped and draped in the usual sterile  fashion.  A Foley catheter had been inserted to monitor urinary output.  A midline incision was made adjacent to the previous midline scar, and  the incision was carried down from the skin,  subcutaneous tissue, down  to the rectus fascia.  Covers were placed on the edge of the fascia, and  meticulous dissection was required in an effort to enter the peritoneal  cavity without incurring any injury to the bowel.  It was noted that  small bowel was adhered to different segments of the anterior peritoneal  surface which was meticulously dissected as well as multiple loops of  small bowel that required extensive adhesiolysis, especially down in the  pelvis and left pelvic sidewall in an effort to free the area to  visualize the pelvis.  The patient had been placed in Trendelenburg  position, and O'Connor-O'Sullivan retractors were in place.  After  meticulous dissection, the left ovary was identified and appeared to be  normal but had a hydrosalpinx which was meticulously dissected and freed  with the use of the Bovie cautery and passed off the operative field.  The right ovary had two small clear cysts that  were drained with the  Bovie.  The pelvic cavity was then copiously irrigated with normal  saline solution.  After ascertaining adequate hemostasis, closure was  started.  After the bowel was placed in its position, Seprafilm adhesion  prevention strip was placed overlying the small bowel.  The fascia was  approximated incorporating at times part of the peritoneum and  interrupted suture with 0 Vicryl suture.  The subcutaneous bleeders were  Bovie cauterized.  The OnQ for postoperative analgesia was placed  suprafascially because there was not enough peritoneum to place it  between the peritoneum and fascia, and one catheter was placed through  the skin and above the fascia and laid subcutaneously.  The subcutaneous  tissue was then reapproximated with a with a running stitch of 3-0  Vicryl suture, and the skin was reapproximated with staples.  The OnQ  was flushed with 10 mL of 0.25% Marcaine.  The patient will receive the  OnQ 4 mL per hour of 1% lidocaine for postoperative analgesia.  After  the dressings were in place, the legs were placed on the stirrups in a  high lithotomy position, and the titanium-coated speculum was placed  into the vaginal vault, and the colposcope was brought into view.  The  center position of the vaginal cuff was identified, and the CO2 laser  was then connected to the colposcope, and the CO2 was laser was set at 6  watts, and in a brush-like fashion, the vaginal cuff was lysed  approximately 2-3 mm.  Following this, Estrace vaginal cream was placed  into the vagina.  The patient was then extubated and transferred to the  recovery with stable vital signs.  Blood loss was minimal.  Fluid  resuscitation consisted of 3 liters of LR, and urine output was 150 mL  and clear.      Juan H. Lily Peer, M.D.  Electronically Signed     JHF/MEDQ  D:  02/20/2007  T:  02/20/2007  Job:  [id number]

## 2011-02-15 NOTE — H&P (Signed)
Kelsey [name], Kelsey [name]                          ACCOUNT NO.:  [id number]   MEDICAL RECORD NO.:  [id number]                   PATIENT TYPE:  INP   LOCATION:  0482                                 [facility]ACILITY:  [facility]   PHYSICIAN:  Abigail Miyamoto, M.D.              DATE OF BIRTH:  [birth year]   DATE OF ADMISSION:  05/06/2003  DATE OF DISCHARGE:  05/14/2003                                HISTORY & PHYSICAL   CHIEF COMPLAINT:  Abdominal pain.   HISTORY OF PRESENT ILLNESS:  This is a very pleasant 50 year old female who  is being admitted to the hospital for elective sigmoid colectomy.  She  recently had been treated for diverticulitis with oral antibiotics and after  failure to improve she underwent a CAT scan of the abdomen and pelvis that  showed her to have a large pelvic abscess.  At this point she was admitted  to the hospital and placed on IV antibiotics and under percutaneous drainage  her abscess collection was drained.  Over several days in the hospital she  improved and her drainage catheter could be removed.  She now presents for  elective hemicolectomy.  A barium enema has shown no other abnormalities in  the colon.   PAST MEDICAL HISTORY:  Significant for having had multiple miscarriages.   PAST SURGICAL HISTORY:  Total abdominal hysterectomy.   SOCIAL HISTORY:  She does not smoke.  Does not drink alcohol.   MEDICATIONS:  1. Cipro.  2. Flagyl.   ALLERGIES:  She has no known drug allergies.   PHYSICAL EXAMINATION:  GENERAL:  Thin female in no acute distress.  She is  well appearing.  VITAL SIGNS:  She is afebrile.  Vital signs are stable.  HEENT:  Eyes are anicteric.  Pupils reactive bilaterally.  NECK:  Supple.  There is no adenopathy or thyromegaly.  Oropharynx is clear.  LUNGS:  Normal effort.  Clear to auscultation bilaterally.  CARDIOVASCULAR:  Regular rate and rhythm with no murmurs.  There is no  peripheral edema.  ABDOMEN:  Soft.  There is some fullness.  Mild  tenderness in the left lower  quadrant.  There are no other masses.  There is no organomegaly.  There are  no hernias.  EXTREMITIES:  Warm and well perfused.    ASSESSMENT/PLAN:  This is a 50 year old female with a history of sigmoid  diverticulitis and microperforation with abscess who now presents for  elective resection.  Preoperative bowel preparation has been given.  The  risks of surgery including bleeding and infection, anastomotic breakdown,  abscess formation, etc. were discussed and she wishes to proceed.  Surgery  will be performed at this admission.  Abigail Miyamoto, M.D.    DB/MEDQ  D:  06/02/2003  T:  06/02/2003  Job:  [id number]

## 2011-11-08 ENCOUNTER — Other Ambulatory Visit: Payer: Self-pay | Admitting: Gynecology

## 2011-11-08 DIAGNOSIS — Z1231 Encounter for screening mammogram for malignant neoplasm of breast: Secondary | ICD-10-CM

## 2011-12-05 ENCOUNTER — Ambulatory Visit (HOSPITAL_COMMUNITY)
Admission: RE | Admit: 2011-12-05 | Discharge: 2011-12-05 | Disposition: A | Discharge status: Home / Self Care | Payer: BC Managed Care – PPO | Source: Ambulatory Visit | Attending: Gynecology | Admitting: Gynecology

## 2011-12-05 DIAGNOSIS — Z1231 Encounter for screening mammogram for malignant neoplasm of breast: Secondary | ICD-10-CM | POA: Insufficient documentation

## 2012-01-15 DIAGNOSIS — N96 Recurrent pregnancy loss: Secondary | ICD-10-CM | POA: Insufficient documentation

## 2012-01-15 DIAGNOSIS — Z8719 Personal history of other diseases of the digestive system: Secondary | ICD-10-CM | POA: Insufficient documentation

## 2012-01-17 ENCOUNTER — Ambulatory Visit (INDEPENDENT_AMBULATORY_CARE_PROVIDER_SITE_OTHER): Payer: BC Managed Care – PPO | Admitting: Women's Health

## 2012-01-17 ENCOUNTER — Encounter: Payer: Self-pay | Admitting: Women's Health

## 2012-01-17 VITALS — BP 120/80 | Ht 62.5 in | Wt 141.0 lb

## 2012-01-17 DIAGNOSIS — Z833 Family history of diabetes mellitus: Secondary | ICD-10-CM

## 2012-01-17 DIAGNOSIS — Z01419 Encounter for gynecological examination (general) (routine) without abnormal findings: Secondary | ICD-10-CM

## 2012-01-17 DIAGNOSIS — Z13220 Encounter for screening for lipoid disorders: Secondary | ICD-10-CM

## 2012-01-17 DIAGNOSIS — Z780 Asymptomatic menopausal state: Secondary | ICD-10-CM

## 2012-01-17 DIAGNOSIS — N898 Other specified noninflammatory disorders of vagina: Secondary | ICD-10-CM

## 2012-01-17 NOTE — Patient Instructions (Signed)

## 2012-01-17 NOTE — Progress Notes (Signed)
Kelsey [name] Jun 27, 1961 [id number]    History:    The patient presents for annual exam. Postmenopausal/no ERT. History of TAH, LSO for fibroids. History of multiple pregnancy losses. Has one adopted child. History of normal mammograms. History of a DVT/Gilberts syndrome.   Past medical history, past surgical history, family history and social history were all reviewed and documented in the EPIC chart. History of a diverticular abscess, colonoscopy due, will schedule. Works as a Manufacturing systems engineer. [name] 18 playing soccer and doing well at [employer].   ROS:  A  ROS was performed and pertinent positives and negatives are included in the history.  Exam:  There were no vitals filed for this visit.  General appearance:  Normal Head/Neck:  Normal, without cervical or supraclavicular adenopathy. Thyroid:  Symmetrical, normal in size, without palpable masses or nodularity. Respiratory  Effort:  Normal  Auscultation:  Clear without wheezing or rhonchi Cardiovascular  Auscultation:  Regular rate, without rubs, murmurs or gallops  Edema/varicosities:  Not grossly evident Abdominal  Soft,nontender, without masses, guarding or rebound.  Liver/spleen:  No organomegaly noted  Hernia:  None appreciated  Skin  Inspection:  Grossly normal  Palpation:  Grossly normal Neurologic/psychiatric  Orientation:  Normal with appropriate conversation.  Mood/affect:  Normal  Genitourinary    Breasts: Examined lying and sitting.     Right: Without masses, retractions, discharge or axillary adenopathy.     Left: Without masses, retractions, discharge or axillary adenopathy.   Inguinal/mons:  Normal without inguinal adenopathy  External genitalia:  Normal  BUS/Urethra/Skene's glands:  Normal  Bladder:  Normal  Vagina:  Normal/atrophic wet prep negative  Cervix:  Absent  Uterus:    Adnexa/parametria:     Rt: Without masses or tenderness.   Lt: Without masses or tenderness.  Anus and  perineum: Normal  Digital rectal exam: Normal sphincter tone without palpated masses or tenderness  Assessment/Plan:  51 y.o. MW at G9 A9 for annual exam with complaint of dyspareunia.  Dyspareunia/vaginal dryness No ERT/ history of DVT  Plan: Reviewed importance of vaginal lubricants with intercourse. SBE's, continue annual mammogram, calcium rich diet, vitamin D 2000 daily, exercise, fish oil supplement daily. Scheduled bone density here. Due for a repeat colonoscopy this year and will schedule. CBC, glucose, lipid profile, UAHarrington Challenger WHNP, 12:52 PM 01/17/2012

## 2012-01-18 LAB — CBC WITH DIFFERENTIAL/PLATELET
Basophils Relative: 1 % (ref 0–1)
HCT: 41 % (ref 36.0–46.0)
Hemoglobin: 14.2 g/dL (ref 12.0–15.0)
Lymphocytes Relative: 32 % (ref 12–46)
MCHC: 34.6 g/dL (ref 30.0–36.0)
Monocytes Absolute: 0.3 10*3/uL (ref 0.1–1.0)
Monocytes Relative: 7 % (ref 3–12)
Neutro Abs: 2.5 10*3/uL (ref 1.7–7.7)

## 2012-01-18 LAB — URINALYSIS W MICROSCOPIC + REFLEX CULTURE
Crystals: NONE SEEN
Glucose, UA: NEGATIVE mg/dL
pH: 5 (ref 5.0–8.0)

## 2012-01-18 LAB — LIPID PANEL
LDL Cholesterol: 207 mg/dL — ABNORMAL HIGH (ref 0–99)
VLDL: 25 mg/dL (ref 0–40)

## 2012-01-19 LAB — URINE CULTURE

## 2012-01-23 ENCOUNTER — Other Ambulatory Visit: Payer: Self-pay | Admitting: Women's Health

## 2012-01-23 DIAGNOSIS — D696 Thrombocytopenia, unspecified: Secondary | ICD-10-CM

## 2012-01-29 LAB — WET PREP FOR TRICH, YEAST, CLUE

## 2012-02-05 ENCOUNTER — Ambulatory Visit (INDEPENDENT_AMBULATORY_CARE_PROVIDER_SITE_OTHER): Payer: BC Managed Care – PPO

## 2012-02-05 ENCOUNTER — Other Ambulatory Visit: Payer: Self-pay | Admitting: Gynecology

## 2012-02-05 DIAGNOSIS — M899 Disorder of bone, unspecified: Secondary | ICD-10-CM

## 2012-02-05 DIAGNOSIS — Z780 Asymptomatic menopausal state: Secondary | ICD-10-CM

## 2012-02-05 DIAGNOSIS — M8580 Other specified disorders of bone density and structure, unspecified site: Secondary | ICD-10-CM

## 2012-02-05 DIAGNOSIS — M949 Disorder of cartilage, unspecified: Secondary | ICD-10-CM

## 2012-04-07 ENCOUNTER — Encounter: Payer: Self-pay | Admitting: Cardiovascular Disease

## 2012-04-07 ENCOUNTER — Ambulatory Visit (INDEPENDENT_AMBULATORY_CARE_PROVIDER_SITE_OTHER): Payer: BC Managed Care – PPO | Admitting: Cardiovascular Disease

## 2012-04-07 VITALS — BP 129/85 | HR 70 | Ht 61.25 in | Wt 146.4 lb

## 2012-04-07 DIAGNOSIS — R079 Chest pain, unspecified: Secondary | ICD-10-CM

## 2012-04-07 DIAGNOSIS — R0789 Other chest pain: Secondary | ICD-10-CM

## 2012-04-07 NOTE — Patient Instructions (Signed)
Your physician has requested that you have a lexiscan myoview. . Please follow instruction sheet, as given.  Your physician recommends that you schedule a follow-up appointment in: 1 MONTH

## 2012-04-07 NOTE — Progress Notes (Signed)
    Kelsey [name] Date of Birth  August 23, 1961       [facility]    [place] [address]  [address] [place], [place]  [address]   [place], [place]  [address] [phone]     [phone]   Fax  [phone]    Fax [phone]  Problem List: 1. Chest pain 2. Hyperlipidemia  History of Present Illness:  Kelsey [name] is a 51 yo with 2 week history of chest discomfort. The pain is described as a pressure-like sensation. It is not related to exertion. It is also not related to eating, drinking, change of disease  Or taking a deep breath.  She has a hx of bilateral DVT ( post op for D&C)  Current Outpatient Prescriptions on File Prior to Visit  Medication Sig Dispense Refill  . atorvastatin (LIPITOR) 20 MG tablet Take 20 mg by mouth. Take every other Day      . Cholecalciferol (VITAMIN D) 2000 UNITS CAPS Take by mouth.      . Omega-3 Fatty Acids (FISH OIL PO) Take by mouth.        Allergies  Allergen Reactions  . Mango Butter     PT REPORTS ALLERGY TO MANGOES    Past Medical History  Diagnosis Date  . Fibroid   . Multiple pregnancy loss, not currently pregnant     HISTORY OF  . DVT (deep venous thrombosis)     HISTORY OF  . History of diverticular abscess   . Gilbert disease   . 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 Cesarean section   . Abdominal hysterectomy     TAH WITH INCIDENTAL ENTEROTOMY  . Abdominal surgery     MYOMECTOMY  . Sigmoid colectomy   . Laparotomy, left salpingectomy 01/2007  . 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 Never Smoker   Smokeless tobacco  . Never Used    History  Alcohol Use  . Yes    SOCIALLY ONLY    Family History  Problem Relation Age of Onset  . Breast cancer Maternal Aunt   . Diabetes Maternal Grandmother   . Diabetes Paternal Grandmother     Reviw of Systems:  Reviewed in the HPI.  All other systems are  negative.  Physical Exam: Blood pressure 129/85, pulse 70, height 5' 1.25" (1.556 m), weight 146 lb 6.4 oz (66.407 kg). General: Well developed, well nourished, in no acute distress.  Head: Normocephalic, atraumatic, sclera non-icteric, mucus membranes are moist,   Neck: Supple. Carotids are 2 + without bruits. No JVD  Lungs: Clear bilaterally to auscultation.  Heart: regular rate.  normal  S1 S2. No murmurs, gallops or rubs.  Abdomen: Soft, non-tender, non-distended with normal bowel sounds. No hepatomegaly. No rebound/guarding. No masses.  Msk:  Strength and tone are normal  Extremities: No clubbing or cyanosis. No edema.  Distal pedal pulses are 2+ and equal bilaterally.  Neuro: Alert and oriented X 3. Moves all extremities spontaneously.  Psych:  Responds to questions appropriately with a normal affect.  ECG: 04/07/2012-normal sinus rhythm. She has T-wave inversions in the inferior leads and lateral leads consistent with possible ischemia.  Assessment / Plan:

## 2012-04-07 NOTE — Assessment & Plan Note (Addendum)
Kelsey [name]  presents with episodes of chest pressure and ST/T wave changes in inferior leads. Her chest pains are nonexertional but I am concerned about an anginal equivalent.  She has a history of hyperlipidemia.  We'll schedule her for a [employer] study for further evaluation. I'll see her again in the office in one to 2 months for followup visit.

## 2012-04-09 ENCOUNTER — Encounter: Payer: Self-pay | Admitting: Cardiovascular Disease

## 2012-04-16 ENCOUNTER — Ambulatory Visit (HOSPITAL_COMMUNITY): Payer: BC Managed Care – PPO | Attending: Cardiovascular Disease | Admitting: Radiology

## 2012-04-16 VITALS — BP 119/59 | Ht 61.0 in | Wt 142.0 lb

## 2012-04-16 DIAGNOSIS — R079 Chest pain, unspecified: Secondary | ICD-10-CM

## 2012-04-16 DIAGNOSIS — R0789 Other chest pain: Secondary | ICD-10-CM

## 2012-04-16 DIAGNOSIS — R0602 Shortness of breath: Secondary | ICD-10-CM

## 2012-04-16 MED ORDER — TECHNETIUM TC 99M TETROFOSMIN IV KIT
11.0000 | PACK | Freq: Once | INTRAVENOUS | Status: AC | PRN
  Administered 2012-04-16: 11 via INTRAVENOUS

## 2012-04-16 MED ORDER — TECHNETIUM TC 99M TETROFOSMIN IV KIT
33.0000 | PACK | Freq: Once | INTRAVENOUS | Status: AC | PRN
  Administered 2012-04-16: 33 via INTRAVENOUS

## 2012-04-16 MED ORDER — REGADENOSON 0.4 MG/5ML IV SOLN
0.4000 mg | Freq: Once | INTRAVENOUS | Status: AC
  Administered 2012-04-16: 0.4 mg via INTRAVENOUS

## 2012-04-16 NOTE — Progress Notes (Signed)
[facility] SITE 3 NUCLEAR MED [address] [place] [address] [phone]  Cardiology Nuclear Med Study  Kelsey [name] is a 51 y.o. female     MRN : [id number]     DOB: [birth year]  Procedure Date: 04/16/2012  Nuclear Med Background Indication for Stress Test:  Evaluation for Ischemia and Abnormal EKG History:  H/O bilateral DVTS Cardiac Risk Factors: Lipids  Symptoms:  Chest Pressure   Nuclear Pre-Procedure Caffeine/Decaff Intake:  None > 12 hrs NPO After: 9:00pm   Lungs:  clear O2 Sat: 96% on room air. IV 0.9% NS with Angio Cath:  20g  IV Site: R Antecubital x 1, tolerated well IV Started by:  Irean Hong, RN  Chest Size (in):  34 Cup Size: B  Height: 5\' 1"  (1.549 m)  Weight:  142 lb (64.411 kg)  BMI:  Body mass index is 26.83 kg/(m^2). Tech Comments:  n/a    Nuclear Med Study 1 or 2 day study: 1 day  Stress Test Type:  Treadmill/Lexiscan  Reading MD: Willa Rough, MD  Order Authorizing Provider:  Kristeen Miss, MD  Resting Radionuclide: Technetium 74m Tetrofosmin  Resting Radionuclide Dose: 11.0 mCi   Stress Radionuclide:  Technetium 42m Tetrofosmin  Stress Radionuclide Dose: 32.0 mCi           Stress Protocol Rest HR: 59 Stress HR: 96  Rest BP: 119/69 Stress BP: 140/67  Exercise Time (min): n/a METS: n/a   Predicted Max HR: 169 bpm % Max HR: 56.8 bpm Rate Pressure Product: 28413   Dose of Adenosine (mg):  n/a Dose of Lexiscan: 0.4 mg  Dose of Atropine (mg): n/a Dose of Dobutamine: n/a mcg/kg/min (at max HR)  Stress Test Technologist: Milana Na, EMT-P  Nuclear Technologist:  Domenic Polite, CNMT     Rest Procedure:  Myocardial perfusion imaging was performed at rest 45 minutes following the intravenous administration of Technetium 33m Tetrofosmin. Rest ECG: NSR - Normal EKG  Stress Procedure:  The patient received IV Lexiscan 0.4 mg over 15-seconds with concurrent low level exercise and then Technetium 32m Tetrofosmin was  injected at 30-seconds while the patient continued walking one more minute. There were non specific changes, hot, and abdominal pain with Lexiscan. Quantitative spect images were obtained after a 45-minute delay. Stress ECG: No significant change from baseline ECG  QPS Raw Data Images:  Normal; no motion artifact; normal heart/lung ratio. Stress Images:  Normal homogeneous uptake in all areas of the myocardium. Rest Images:  Normal homogeneous uptake in all areas of the myocardium. Subtraction (SDS):  Normal Transient Ischemic Dilatation (Normal <1.22):  1.08 Lung/Heart Ratio (Normal <0.45):  0.31  Quantitative Gated Spect Images QGS EDV:  73 ml QGS ESV:  20 ml  Impression Exercise Capacity:  Lexiscan with low level exercise. BP Response:  Normal blood pressure response. Clinical Symptoms:  No significant symptoms noted. ECG Impression:  No significant ST segment change suggestive of ischemia. Comparison with Prior Nuclear Study: No images to compare  Overall Impression:  Normal stress nuclear study.  LV Ejection Fraction: 72%.  LV Wall Motion:  NL LV Function; NL Wall Motion  [name]

## 2012-05-25 ENCOUNTER — Ambulatory Visit: Payer: BC Managed Care – PPO | Admitting: Cardiovascular Disease

## 2012-06-04 ENCOUNTER — Encounter: Payer: Self-pay | Admitting: Cardiovascular Disease

## 2012-06-04 ENCOUNTER — Ambulatory Visit (INDEPENDENT_AMBULATORY_CARE_PROVIDER_SITE_OTHER): Payer: BC Managed Care – PPO | Admitting: Cardiovascular Disease

## 2012-06-04 VITALS — BP 114/74 | HR 63 | Ht 61.0 in | Wt 146.8 lb

## 2012-06-04 DIAGNOSIS — R0789 Other chest pain: Secondary | ICD-10-CM

## 2012-06-04 NOTE — Patient Instructions (Addendum)
Your physician recommends that you continue on your current medications as directed. Please refer to the Current Medication list given to you today.  Your physician recommends that you schedule a follow-up appointment in: as needed  

## 2012-06-04 NOTE — Progress Notes (Signed)
    Kelsey [name] Date of Birth  [birth year]       [facility]    [place] [address]  [address] [place], [place]  [address]   [place], [place]  [address] [phone]     [phone]   Fax  [phone]    Fax [phone]  Problem List: 1. Chest pain 2. Hyperlipidemia  History of Present Illness:  Kelsey [name] is a 51 yo with 2 week history of chest discomfort. The pain is described as a pressure-like sensation. It is not related to exertion. It is also not related to eating, drinking, change of disease  Or taking a deep breath.  She has a hx of bilateral DVT ( post op for D&C).  She had a stress Myoview study which was normal. Fortunately, she's not had any recurrent chest pain since that time.  Current Outpatient Prescriptions on File Prior to Visit  Medication Sig Dispense Refill  . atorvastatin (LIPITOR) 20 MG tablet Take 20 mg by mouth. Take every other Day      . Cholecalciferol (VITAMIN D) 2000 UNITS CAPS Take by mouth.      . Omega-3 Fatty Acids (FISH OIL PO) Take by mouth.        Allergies  Allergen Reactions  . Mango Butter     PT REPORTS ALLERGY TO MANGOES    Past Medical History  Diagnosis Date  . Fibroid   . Multiple pregnancy loss, not currently pregnant     HISTORY OF  . DVT (deep venous thrombosis)     HISTORY OF  . History of diverticular abscess   . Gilbert disease   . 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 Cesarean section   . Abdominal hysterectomy     TAH WITH INCIDENTAL ENTEROTOMY  . Abdominal surgery     MYOMECTOMY  . Sigmoid colectomy   . Laparotomy, left salpingectomy 01/2007  . 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 Never Smoker   Smokeless tobacco  . Never Used    History  Alcohol Use  . Yes    SOCIALLY ONLY    Family History  Problem Relation Age of Onset  . Breast cancer Maternal Aunt   . Diabetes Maternal Grandmother     . Diabetes Paternal Grandmother     Reviw of Systems:  Reviewed in the HPI.  All other systems are negative.  Physical Exam: Blood pressure 114/74, pulse 63, height 5\' 1"  (1.549 m), weight 146 lb 12.8 oz (66.588 kg). General: Well developed, well nourished, in no acute distress.  Head: Normocephalic, atraumatic, sclera non-icteric, mucus membranes are moist,   Neck: Supple. Carotids are 2 + without bruits. No JVD  Lungs: Clear bilaterally to auscultation.  Heart: regular rate.  normal  S1 S2. No murmurs, gallops or rubs.  Abdomen: Soft, non-tender, non-distended with normal bowel sounds. No hepatomegaly. No rebound/guarding. No masses.  Msk:  Strength and tone are normal  Extremities: No clubbing or cyanosis. No edema.  Distal pedal pulses are 2+ and equal bilaterally.  Neuro: Alert and oriented X 3. Moves all extremities spontaneously.  Psych:  Responds to questions appropriately with a normal affect.  ECG: 04/07/2012-normal sinus rhythm. She has T-wave inversions in the inferior leads and lateral leads consistent with possible ischemia.  Assessment / Plan:

## 2012-06-04 NOTE — Assessment & Plan Note (Signed)
Kelsey [name] is doing well. She presented with some chest pain several months ago. Her stress Myoview study was normal.  She sees Dr. Turner Daniels for management of her cholesterol. She will return to see her for followup visit. I'll see her on an as-needed basis.

## 2012-11-06 ENCOUNTER — Other Ambulatory Visit: Payer: Self-pay | Admitting: Gynecology

## 2012-11-06 DIAGNOSIS — Z1231 Encounter for screening mammogram for malignant neoplasm of breast: Secondary | ICD-10-CM

## 2012-12-07 ENCOUNTER — Ambulatory Visit (HOSPITAL_COMMUNITY)
Admission: RE | Admit: 2012-12-07 | Discharge: 2012-12-07 | Disposition: A | Discharge status: Home / Self Care | Payer: BC Managed Care – PPO | Source: Ambulatory Visit | Attending: Gynecology | Admitting: Gynecology

## 2012-12-07 DIAGNOSIS — Z1231 Encounter for screening mammogram for malignant neoplasm of breast: Secondary | ICD-10-CM | POA: Insufficient documentation

## 2013-03-04 ENCOUNTER — Encounter: Payer: Self-pay | Admitting: Women's Health

## 2013-03-04 ENCOUNTER — Ambulatory Visit (INDEPENDENT_AMBULATORY_CARE_PROVIDER_SITE_OTHER): Payer: BC Managed Care – PPO | Admitting: Women's Health

## 2013-03-04 VITALS — BP 134/88 | Ht 62.25 in | Wt 146.0 lb

## 2013-03-04 DIAGNOSIS — N96 Recurrent pregnancy loss: Secondary | ICD-10-CM

## 2013-03-04 DIAGNOSIS — Z01419 Encounter for gynecological examination (general) (routine) without abnormal findings: Secondary | ICD-10-CM

## 2013-03-04 NOTE — Progress Notes (Signed)
Kelsey [name] 1960-10-10 [id number]    History:    The patient presents for annual exam.  Postmenopausal on no HRT TAH with LSO in 99. History of recurrent pregnancy losses. Kirt Boys who is adopted is 76 and doing well in College. Scheduled colonoscopy this summer. DEXA 01/2012-Osteopenia T score -1.7 at left femoral neck, FRAX 4.9%/0.5%.   Past medical history, past surgical history, family history and social history were all reviewed and documented in the EPIC chart. Preschool teacher. History of DVT, diverticular abscess, Gilbert's disease. 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   ROS:  A  ROS was performed and pertinent positives and negatives are included in the history.  Exam:  Filed Vitals:   03/04/13 1418  BP: 134/88    General appearance:  Normal Head/Neck:  Normal, without cervical or supraclavicular adenopathy. Thyroid:  Symmetrical, normal in size, without palpable masses or nodularity. Respiratory  Effort:  Normal  Auscultation:  Clear without wheezing or rhonchi Cardiovascular  Auscultation:  Regular rate, without rubs, murmurs or gallops  Edema/varicosities:  Not grossly evident Abdominal  Soft,nontender, without masses, guarding or rebound.  Liver/spleen:  No organomegaly noted  Hernia:  None appreciated  Skin  Inspection:  Grossly normal  Palpation:  Grossly normal Neurologic/psychiatric  Orientation:  Normal with appropriate conversation.  Mood/affect:  Normal  Genitourinary    Breasts: Examined lying and sitting.     Right: Without masses, retractions, discharge or axillary adenopathy.     Left: Without masses, retractions, discharge or axillary adenopathy.   Inguinal/mons:  Normal without inguinal adenopathy  External genitalia:  Normal  BUS/Urethra/Skene's glands:  Normal  Bladder:  Normal  Vagina:  atrophic  Cervix:  absent Uterus:absent  Adnexa/parametria:     Rt: Without masses or tenderness.   Lt: Without masses or tenderness.  Anus and  perineum: Normal  Digital rectal exam: Normal sphincter tone without palpated masses or tenderness  Assessment/Plan:  52 y.o. M. WF G9 P0 for annual exam.     Postmenopausal no HRT/TAH with LSO History of DVT Osteopenia  Plan: SBE's, continue annual mammogram, calcium rich diet, vitamin D 2000 daily encouraged. Home safety, fall prevention and importance of regular exercise for bone health reviewed. Repeat DEXA next year. labs at primary care. Discussed possible Estring for vaginal dryness declines, will continue vaginal lubricants. Reviewed blood pressure 138/88 elevated, has followup with primary care in 2 weeks and will check away from office as well.    [facility], 5:27 PM 03/04/2013

## 2013-03-04 NOTE — Patient Instructions (Addendum)
Health Recommendations for Postmenopausal Women Respected and ongoing research has looked at the most common causes of death, disability, and poor quality of life in postmenopausal women. The causes include heart disease, diseases of blood vessels, diabetes, depression, cancer, and bone loss (osteoporosis). Many things can be done to help lower the chances of developing these and other common problems: CARDIOVASCULAR DISEASE Heart Disease: A heart attack is a medical emergency. Know the signs and symptoms of a heart attack. Below are things women can do to reduce their risk for heart disease.   Do not smoke. If you smoke, quit.  Aim for a healthy weight. Being overweight causes many preventable deaths. Eat a healthy and balanced diet and drink an adequate amount of liquids.  Get moving. Make a commitment to be more physically active. Aim for 30 minutes of activity on most, if not all days of the week.  Eat for heart health. Choose a diet that is low in saturated fat and cholesterol and eliminate trans fat. Include whole grains, vegetables, and fruits. Read and understand the labels on food containers before buying.  Know your numbers. Ask your caregiver to check your blood pressure, cholesterol (total, HDL, LDL, triglycerides) and blood glucose. Work with your caregiver on improving your entire clinical picture.  High blood pressure. Limit or stop your table salt intake (try salt substitute and food seasonings). Avoid salty foods and drinks. Read labels on food containers before buying. Eating well and exercising can help control high blood pressure. STROKE  Stroke is a medical emergency. Stroke may be the result of a blood clot in a blood vessel in the brain or by a brain hemorrhage (bleeding). Know the signs and symptoms of a stroke. To lower the risk of developing a stroke:  Avoid fatty foods.  Quit smoking.  Control your diabetes, blood pressure, and irregular heart rate. THROMBOPHLEBITIS  (BLOOD CLOT) OF THE LEG  Becoming overweight and leading a stationary lifestyle may also contribute to developing blood clots. Controlling your diet and exercising will help lower the risk of developing blood clots. CANCER SCREENING  Breast Cancer: Take steps to reduce your risk of breast cancer.  You should practice "breast self-awareness." This means understanding the normal appearance and feel of your breasts and should include breast self-examination. Any changes detected, no matter how small, should be reported to your caregiver.  After age 40, you should have a clinical breast exam (CBE) every year.  Starting at age 40, you should consider having a mammogram (breast X-ray) every year.  If you have a family history of breast cancer, talk to your caregiver about genetic screening.  If you are at high risk for breast cancer, talk to your caregiver about having an MRI and a mammogram every year.  Intestinal or Stomach Cancer: Tests to consider are a rectal exam, fecal occult blood, sigmoidoscopy, and colonoscopy. Women who are high risk may need to be screened at an earlier age and more often.  Cervical Cancer:  Beginning at age 30, you should have a Pap test every 3 years as long as the past 3 Pap tests have been normal.  If you have had past treatment for cervical cancer or a condition that could lead to cancer, you need Pap tests and screening for cancer for at least 20 years after your treatment.  If you had a hysterectomy for a problem that was not cancer or a condition that could lead to cancer, then you no longer need Pap tests.    If you are between ages 65 and 70, and you have had normal Pap tests going back 10 years, you no longer need Pap tests.  If Pap tests have been discontinued, risk factors (such as a new sexual partner) need to be reassessed to determine if screening should be resumed.  Some medical problems can increase the chance of getting cervical cancer. In these  cases, your caregiver may recommend more frequent screening and Pap tests.  Uterine Cancer: If you have vaginal bleeding after reaching menopause, you should notify your caregiver.  Ovarian cancer: Other than yearly pelvic exams, there are no reliable tests available to screen for ovarian cancer at this time except for yearly pelvic exams.  Lung Cancer: Yearly chest X-rays can detect lung cancer and should be done on high risk women, such as cigarette smokers and women with chronic lung disease (emphysema).  Skin Cancer: A complete body skin exam should be done at your yearly examination. Avoid overexposure to the sun and ultraviolet light lamps. Use a strong sun block cream when in the sun. All of these things are important in lowering the risk of skin cancer. MENOPAUSE Menopause Symptoms: Hormone therapy products are effective for treating symptoms associated with menopause:  Moderate to severe hot flashes.  Night sweats.  Mood swings.  Headaches.  Tiredness.  Loss of sex drive.  Insomnia.  Other symptoms. Hormone replacement carries certain risks, especially in older women. Women who use or are thinking about using estrogen or estrogen with progestin treatments should discuss that with their caregiver. Your caregiver will help you understand the benefits and risks. The ideal dose of hormone replacement therapy is not known. The Food and Drug Administration (FDA) has concluded that hormone therapy should be used only at the lowest doses and for the shortest amount of time to reach treatment goals.  OSTEOPOROSIS Protecting Against Bone Loss and Preventing Fracture: If you use hormone therapy for prevention of bone loss (osteoporosis), the risks for bone loss must outweigh the risk of the therapy. Ask your caregiver about other medications known to be safe and effective for preventing bone loss and fractures. To guard against bone loss or fractures, the following is recommended:  If  you are less than age 50, take 1000 mg of calcium and at least 600 mg of Vitamin D per day.  If you are greater than age 50 but less than age 70, take 1200 mg of calcium and at least 600 mg of Vitamin D per day.  If you are greater than age 70, take 1200 mg of calcium and at least 800 mg of Vitamin D per day. Smoking and excessive alcohol intake increases the risk of osteoporosis. Eat foods rich in calcium and vitamin D and do weight bearing exercises several times a week as your caregiver suggests. DIABETES Diabetes Melitus: If you have Type I or Type 2 diabetes, you should keep your blood sugar under control with diet, exercise and recommended medication. Avoid too many sweets, starchy and fatty foods. Being overweight can make control more difficult. COGNITION AND MEMORY Cognition and Memory: Menopausal hormone therapy is not recommended for the prevention of cognitive disorders such as Alzheimer's disease or memory loss.  DEPRESSION  Depression may occur at any age, but is common in elderly women. The reasons may be because of physical, medical, social (loneliness), or financial problems and needs. If you are experiencing depression because of medical problems and control of symptoms, talk to your caregiver about this. Physical activity and   exercise may help with mood and sleep. Community and volunteer involvement may help your sense of value and worth. If you have depression and you feel that the problem is getting worse or becoming severe, talk to your caregiver about treatment options that are best for you. ACCIDENTS  Accidents are common and can be serious in the elderly woman. Prepare your house to prevent accidents. Eliminate throw rugs, place hand bars in the bath, shower and toilet areas. Avoid wearing high heeled shoes or walking on wet, snowy, and icy areas. Limit or stop driving if you have vision or hearing problems, or you feel you are unsteady with you movements and  reflexes. HEPATITIS C Hepatitis C is a type of viral infection affecting the liver. It is spread mainly through contact with blood from an infected person. It can be treated, but if left untreated, it can lead to severe liver damage over years. Many people who are infected do not know that the virus is in their blood. If you are a "baby-boomer", it is recommended that you have one screening test for Hepatitis C. IMMUNIZATIONS  Several immunizations are important to consider having during your senior years, including:   Tetanus, diptheria, and pertussis booster shot.  Influenza every year before the flu season begins.  Pneumonia vaccine.  Shingles vaccine.  Others as indicated based on your specific needs. Talk to your caregiver about these. Document Released: 11/08/2005 Document Revised: 09/02/2012 Document Reviewed: 07/04/2008 [employer] Patient Information 2014 [employer].  

## 2013-08-04 ENCOUNTER — Encounter: Payer: Self-pay | Admitting: Women's Health

## 2013-12-23 ENCOUNTER — Other Ambulatory Visit: Payer: Self-pay | Admitting: Gynecology

## 2013-12-23 DIAGNOSIS — Z1231 Encounter for screening mammogram for malignant neoplasm of breast: Secondary | ICD-10-CM

## 2014-01-12 ENCOUNTER — Ambulatory Visit (HOSPITAL_COMMUNITY)
Admission: RE | Admit: 2014-01-12 | Discharge: 2014-01-12 | Disposition: A | Discharge status: Home / Self Care | Payer: BC Managed Care – PPO | Source: Ambulatory Visit | Attending: Gynecology | Admitting: Gynecology

## 2014-01-12 DIAGNOSIS — Z1231 Encounter for screening mammogram for malignant neoplasm of breast: Secondary | ICD-10-CM | POA: Insufficient documentation

## 2014-03-15 ENCOUNTER — Ambulatory Visit (INDEPENDENT_AMBULATORY_CARE_PROVIDER_SITE_OTHER): Payer: BC Managed Care – PPO | Admitting: Women's Health

## 2014-03-15 ENCOUNTER — Encounter: Payer: Self-pay | Admitting: Women's Health

## 2014-03-15 VITALS — BP 125/78 | Ht 62.0 in | Wt 141.8 lb

## 2014-03-15 DIAGNOSIS — M8580 Other specified disorders of bone density and structure, unspecified site: Secondary | ICD-10-CM | POA: Insufficient documentation

## 2014-03-15 DIAGNOSIS — M899 Disorder of bone, unspecified: Secondary | ICD-10-CM

## 2014-03-15 DIAGNOSIS — M949 Disorder of cartilage, unspecified: Secondary | ICD-10-CM

## 2014-03-15 DIAGNOSIS — Z01419 Encounter for gynecological examination (general) (routine) without abnormal findings: Secondary | ICD-10-CM

## 2014-03-15 NOTE — Patient Instructions (Signed)
Health Recommendations for Postmenopausal Women Respected and ongoing research has looked at the most common causes of death, disability, and poor quality of life in postmenopausal women. The causes include heart disease, diseases of blood vessels, diabetes, depression, cancer, and bone loss (osteoporosis). Many things can be done to help lower the chances of developing these and other common problems: CARDIOVASCULAR DISEASE Heart Disease: A heart attack is a medical emergency. Know the signs and symptoms of a heart attack. Below are things women can do to reduce their risk for heart disease.   Do not smoke. If you smoke, quit.  Aim for a healthy weight. Being overweight causes many preventable deaths. Eat a healthy and balanced diet and drink an adequate amount of liquids.  Get moving. Make a commitment to be more physically active. Aim for 30 minutes of activity on most, if not all days of the week.  Eat for heart health. Choose a diet that is low in saturated fat and cholesterol and eliminate trans fat. Include whole grains, vegetables, and fruits. Read and understand the labels on food containers before buying.  Know your numbers. Ask your caregiver to check your blood pressure, cholesterol (total, HDL, LDL, triglycerides) and blood glucose. Work with your caregiver on improving your entire clinical picture.  High blood pressure. Limit or stop your table salt intake (try salt substitute and food seasonings). Avoid salty foods and drinks. Read labels on food containers before buying. Eating well and exercising can help control high blood pressure. STROKE  Stroke is a medical emergency. Stroke may be the result of a blood clot in a blood vessel in the brain or by a brain hemorrhage (bleeding). Know the signs and symptoms of a stroke. To lower the risk of developing a stroke:  Avoid fatty foods.  Quit smoking.  Control your diabetes, blood pressure, and irregular heart rate. THROMBOPHLEBITIS  (BLOOD CLOT) OF THE LEG  Becoming overweight and leading a stationary lifestyle may also contribute to developing blood clots. Controlling your diet and exercising will help lower the risk of developing blood clots. CANCER SCREENING  Breast Cancer: Take steps to reduce your risk of breast cancer.  You should practice "breast self-awareness." This means understanding the normal appearance and feel of your breasts and should include breast self-examination. Any changes detected, no matter how small, should be reported to your caregiver.  After age 40, you should have a clinical breast exam (CBE) every year.  Starting at age 40, you should consider having a mammogram (breast X-ray) every year.  If you have a family history of breast cancer, talk to your caregiver about genetic screening.  If you are at high risk for breast cancer, talk to your caregiver about having an MRI and a mammogram every year.  Intestinal or Stomach Cancer: Tests to consider are a rectal exam, fecal occult blood, sigmoidoscopy, and colonoscopy. Women who are high risk may need to be screened at an earlier age and more often.  Cervical Cancer:  Beginning at age 30, you should have a Pap test every 3 years as long as the past 3 Pap tests have been normal.  If you have had past treatment for cervical cancer or a condition that could lead to cancer, you need Pap tests and screening for cancer for at least 20 years after your treatment.  If you had a hysterectomy for a problem that was not cancer or a condition that could lead to cancer, then you no longer need Pap tests.    If you are between ages 65 and 70, and you have had normal Pap tests going back 10 years, you no longer need Pap tests.  If Pap tests have been discontinued, risk factors (such as a new sexual partner) need to be reassessed to determine if screening should be resumed.  Some medical problems can increase the chance of getting cervical cancer. In these  cases, your caregiver may recommend more frequent screening and Pap tests.  Uterine Cancer: If you have vaginal bleeding after reaching menopause, you should notify your caregiver.  Ovarian cancer: Other than yearly pelvic exams, there are no reliable tests available to screen for ovarian cancer at this time except for yearly pelvic exams.  Lung Cancer: Yearly chest X-rays can detect lung cancer and should be done on high risk women, such as cigarette smokers and women with chronic lung disease (emphysema).  Skin Cancer: A complete body skin exam should be done at your yearly examination. Avoid overexposure to the sun and ultraviolet light lamps. Use a strong sun block cream when in the sun. All of these things are important in lowering the risk of skin cancer. MENOPAUSE Menopause Symptoms: Hormone therapy products are effective for treating symptoms associated with menopause:  Moderate to severe hot flashes.  Night sweats.  Mood swings.  Headaches.  Tiredness.  Loss of sex drive.  Insomnia.  Other symptoms. Hormone replacement carries certain risks, especially in older women. Women who use or are thinking about using estrogen or estrogen with progestin treatments should discuss that with their caregiver. Your caregiver will help you understand the benefits and risks. The ideal dose of hormone replacement therapy is not known. The Food and Drug Administration (FDA) has concluded that hormone therapy should be used only at the lowest doses and for the shortest amount of time to reach treatment goals.  OSTEOPOROSIS Protecting Against Bone Loss and Preventing Fracture: If you use hormone therapy for prevention of bone loss (osteoporosis), the risks for bone loss must outweigh the risk of the therapy. Ask your caregiver about other medications known to be safe and effective for preventing bone loss and fractures. To guard against bone loss or fractures, the following is recommended:  If  you are less than age 50, take 1000 mg of calcium and at least 600 mg of Vitamin D per day.  If you are greater than age 50 but less than age 70, take 1200 mg of calcium and at least 600 mg of Vitamin D per day.  If you are greater than age 70, take 1200 mg of calcium and at least 800 mg of Vitamin D per day. Smoking and excessive alcohol intake increases the risk of osteoporosis. Eat foods rich in calcium and vitamin D and do weight bearing exercises several times a week as your caregiver suggests. DIABETES Diabetes Melitus: If you have Type I or Type 2 diabetes, you should keep your blood sugar under control with diet, exercise and recommended medication. Avoid too many sweets, starchy and fatty foods. Being overweight can make control more difficult. COGNITION AND MEMORY Cognition and Memory: Menopausal hormone therapy is not recommended for the prevention of cognitive disorders such as Alzheimer's disease or memory loss.  DEPRESSION  Depression may occur at any age, but is common in elderly women. The reasons may be because of physical, medical, social (loneliness), or financial problems and needs. If you are experiencing depression because of medical problems and control of symptoms, talk to your caregiver about this. Physical activity and   exercise may help with mood and sleep. Community and volunteer involvement may help your sense of value and worth. If you have depression and you feel that the problem is getting worse or becoming severe, talk to your caregiver about treatment options that are best for you. ACCIDENTS  Accidents are common and can be serious in the elderly woman. Prepare your house to prevent accidents. Eliminate throw rugs, place hand bars in the bath, shower and toilet areas. Avoid wearing high heeled shoes or walking on wet, snowy, and icy areas. Limit or stop driving if you have vision or hearing problems, or you feel you are unsteady with you movements and  reflexes. HEPATITIS C Hepatitis C is a type of viral infection affecting the liver. It is spread mainly through contact with blood from an infected person. It can be treated, but if left untreated, it can lead to severe liver damage over years. Many people who are infected do not know that the virus is in their blood. If you are a "baby-boomer", it is recommended that you have one screening test for Hepatitis C. IMMUNIZATIONS  Several immunizations are important to consider having during your senior years, including:   Tetanus, diptheria, and pertussis booster shot.  Influenza every year before the flu season begins.  Pneumonia vaccine.  Shingles vaccine.  Others as indicated based on your specific needs. Talk to your caregiver about these. Document Released: 11/08/2005 Document Revised: 09/02/2012 Document Reviewed: 07/04/2008 [employer] Patient Information 2014 [employer].  

## 2014-03-15 NOTE — Progress Notes (Signed)
Kelsey [name] August 06, 1961 [id number]  History:    Presents for annual exam.  TVH with LSO for menorrhagia/No HRT. Complains of lack of sleep due to hot flashes and night sweats. Normal pap and mammogram history. DEXA 2013- T-score -1.7 femoral neck FRAX 4.9%/0.5%. Colonoscopy 06/2013 benign colon polyp , repeat in 2017 per Dr. Collene [name]. History of DVT.   Past medical history, past surgical history, family history and social history were all reviewed and documented in the EPIC chart.  Kelsey [name] 19 working with [place] [employer], [place]  [place].  Diverticular abscess, and clubfoot. Preschool teacher.  ROS:  A  12 point ROS was performed and pertinent positives and negatives are included.  Exam:  Filed Vitals:   03/15/14 0936  BP: 125/78    General appearance:  Normal Thyroid:  Symmetrical, normal in size, without palpable masses or nodularity. Respiratory  Auscultation:  Clear without wheezing or rhonchi Cardiovascular  Auscultation:  Regular rate, without rubs, murmurs or gallops  Edema/varicosities:  Not grossly evident Abdominal  Soft,nontender, without masses, guarding or rebound.  Liver/spleen:  No organomegaly noted  Hernia:  None appreciated  Skin  Inspection:  Grossly normal   Breasts: Examined lying and sitting.     Right: Without masses, retractions, discharge or axillary adenopathy.     Left: Without masses, retractions, discharge or axillary adenopathy. Gentitourinary   Inguinal/mons:  Normal without inguinal adenopathy  External genitalia:  Normal  BUS/Urethra/Skene's glands:  Normal  Vagina:  Atrophic changes consistent with menopause  Cervix:  absent  Uterus:  Absent   Adnexa/parametria:     Rt: Without masses or tenderness.   Lt: Absent  Anus and perineum: Normal  Digital rectal exam: Normal sphincter tone without palpated masses or tenderness  Assessment/Plan:  53 y.o. G9P0 has adopted daughter for annual exam with hot flushes.  Postmenopausal/no HRT/ TVH with LSO   Osteopenia History of DVT Labs primary care  Plan:  Reviewed no HRT, history of DVT, Vitamin E and Effexor discussed, will try vitamin E first and call if no relief.  Lubricants recommended for intercourse. UA, SBE's, continue annual  Mammograms, 3D encouraged, history of dense breasts.  Calcium rich diet, 2000 U of vitamin D, weight-bearing exercise, home safety and fall prevention reviewed.   Note: This dictation was prepared with Dragon/digital dictation.  Any transcriptional errors that result are unintentional. Kelsey [name] [facility], 10:21 AM 03/15/2014

## 2014-03-16 LAB — URINALYSIS W MICROSCOPIC + REFLEX CULTURE
BILIRUBIN URINE: NEGATIVE
Bacteria, UA: NONE SEEN
CASTS: NONE SEEN
CRYSTALS: NONE SEEN
Glucose, UA: NEGATIVE mg/dL
Hgb urine dipstick: NEGATIVE
KETONES UR: NEGATIVE mg/dL
Nitrite: NEGATIVE
PH: 5 (ref 5.0–8.0)
Protein, ur: NEGATIVE mg/dL
SPECIFIC GRAVITY, URINE: 1.019 (ref 1.005–1.030)
SQUAMOUS EPITHELIAL / LPF: NONE SEEN
UROBILINOGEN UA: 0.2 mg/dL (ref 0.0–1.0)

## 2014-03-17 LAB — URINE CULTURE
COLONY COUNT: NO GROWTH
Organism ID, Bacteria: NO GROWTH

## 2014-08-01 ENCOUNTER — Encounter: Payer: Self-pay | Admitting: Women's Health

## 2015-01-16 ENCOUNTER — Other Ambulatory Visit: Payer: Self-pay | Admitting: Women's Health

## 2015-01-16 DIAGNOSIS — Z1231 Encounter for screening mammogram for malignant neoplasm of breast: Secondary | ICD-10-CM

## 2015-01-23 ENCOUNTER — Ambulatory Visit (HOSPITAL_COMMUNITY)
Admission: RE | Admit: 2015-01-23 | Discharge: 2015-01-23 | Disposition: A | Discharge status: Home / Self Care | Payer: Managed Care, Other (non HMO) | Source: Ambulatory Visit | Attending: Women's Health | Admitting: Women's Health

## 2015-01-23 DIAGNOSIS — Z1231 Encounter for screening mammogram for malignant neoplasm of breast: Secondary | ICD-10-CM | POA: Diagnosis not present

## 2015-03-21 ENCOUNTER — Encounter: Payer: Self-pay | Admitting: Women's Health

## 2015-03-29 ENCOUNTER — Encounter: Payer: Self-pay | Admitting: Women's Health

## 2015-03-29 ENCOUNTER — Ambulatory Visit (INDEPENDENT_AMBULATORY_CARE_PROVIDER_SITE_OTHER): Payer: Managed Care, Other (non HMO) | Admitting: Women's Health

## 2015-03-29 VITALS — BP 126/80 | Ht 62.0 in | Wt 143.0 lb

## 2015-03-29 DIAGNOSIS — M8580 Other specified disorders of bone density and structure, unspecified site: Secondary | ICD-10-CM | POA: Diagnosis not present

## 2015-03-29 DIAGNOSIS — Z01419 Encounter for gynecological examination (general) (routine) without abnormal findings: Secondary | ICD-10-CM

## 2015-03-29 DIAGNOSIS — Z13820 Encounter for screening for osteoporosis: Secondary | ICD-10-CM | POA: Diagnosis not present

## 2015-03-29 NOTE — Progress Notes (Signed)
Kelsey [name] 1961-08-03 [id number]   History:    Presents for annual exam. Menopausal/symptoms, 1999-TVH and LSO for DUB. No HRT/Poor sleep due to hot flushes. Interested in HRT for hot flash relief, contraindicated due to patient history of DVT. Declined Effexor, will first try alternative therapies including Vit E and soy.  2014 Colonoscopy/benign polyps, hx of diverticulosis with abscess. Normal PAP and mammogram history. History of multiple pregnancy losses.  Walks 3 miles daily. Healthy diet. 2013 T score -1.7 femoral neck FRAX 4.9%/0.5%  Past medical history, past surgical history, family history and social history were all reviewed and documented in the EPIC chart. Recently retired from teaching preschool. Adopted daughter Kelsey [name] in [place], [place] training.   ROS:  A ROS was performed and pertinent positives and negatives are included.  Exam:  Filed Vitals:   03/29/15 0943  BP: 126/80    General appearance:  Normal Thyroid:  Symmetrical, normal in size, without palpable masses or nodularity. Respiratory  Auscultation:  Clear without wheezing or rhonchi Cardiovascular  Auscultation:  Regular rate, without rubs, murmurs or gallops  Edema/varicosities:  Not grossly evident Abdominal  Soft,nontender, without masses, guarding or rebound.  Liver/spleen:  No organomegaly noted  Hernia:  None appreciated  Skin  Inspection:  Grossly normal   Breasts: Examined lying and sitting.     Right: Without masses, retractions, discharge or axillary adenopathy.     Left: Without masses, retractions, discharge or axillary adenopathy. Gentitourinary   Inguinal/mons:  Normal without inguinal adenopathy  External genitalia:  Normal  BUS/Urethra/Skene's glands:  Normal  Vagina:  Normal, dry  Cervix:  Absent  Uterus:  Absent  Adnexa/parametria:     Rt: Without masses or tenderness.   Lt: Without masses or tenderness.  Anus and perineum: Normal  Digital rectal exam: Normal sphincter tone without  palpated masses or tenderness  Assessment/Plan:  54 y.o. MWF G9P0 for annual exam. Complains of hot flushes and insomnia.  Mensopausal/No HRT/1999-TVH and LSO (DUB) Hot Flushes/Insomnia Hx of DVT Osteopenia without elevated FRAX Labs primary care  Plan: Start regimen of Vit E and/or soy for hot flashes/insomnia. Effexor reviewed and declined will call if wants to proceed  if symptoms do not improve. Schedule DEXA scan this year (2013 T-score  -1.7). SBE's, continue annual screening mammogram, 3-D encouraged history of dense breasts.  Repeat colonoscopy in 2017 per Dr. Collene Mares (2014 - Benign polyps/hx diverticulitis with abscess). Continue regular exercise/healthy diet, vit D 1000.Marland Kitchen UA  [name] [facility], 10:27 AM 03/29/2015

## 2015-03-29 NOTE — Patient Instructions (Signed)
Health Recommendations for Postmenopausal Women Respected and ongoing research has looked at the most common causes of death, disability, and poor quality of life in postmenopausal women. The causes include heart disease, diseases of blood vessels, diabetes, depression, cancer, and bone loss (osteoporosis). Many things can be done to help lower the chances of developing these and other common problems. CARDIOVASCULAR DISEASE Heart Disease: A heart attack is a medical emergency. Know the signs and symptoms of a heart attack. Below are things women can do to reduce their risk for heart disease.   Do not smoke. If you smoke, quit.  Aim for a healthy weight. Being overweight causes many preventable deaths. Eat a healthy and balanced diet and drink an adequate amount of liquids.  Get moving. Make a commitment to be more physically active. Aim for 30 minutes of activity on most, if not all days of the week.  Eat for heart health. Choose a diet that is low in saturated fat and cholesterol and eliminate trans fat. Include whole grains, vegetables, and fruits. Read and understand the labels on food containers before buying.  Know your numbers. Ask your caregiver to check your blood pressure, cholesterol (total, HDL, LDL, triglycerides) and blood glucose. Work with your caregiver on improving your entire clinical picture.  High blood pressure. Limit or stop your table salt intake (try salt substitute and food seasonings). Avoid salty foods and drinks. Read labels on food containers before buying. Eating well and exercising can help control high blood pressure. STROKE  Stroke is a medical emergency. Stroke may be the result of a blood clot in a blood vessel in the brain or by a brain hemorrhage (bleeding). Know the signs and symptoms of a stroke. To lower the risk of developing a stroke:  Avoid fatty foods.  Quit smoking.  Control your diabetes, blood pressure, and irregular heart rate. THROMBOPHLEBITIS  (BLOOD CLOT) OF THE LEG  Becoming overweight and leading a stationary lifestyle may also contribute to developing blood clots. Controlling your diet and exercising will help lower the risk of developing blood clots. CANCER SCREENING  Breast Cancer: Take steps to reduce your risk of breast cancer.  You should practice "breast self-awareness." This means understanding the normal appearance and feel of your breasts and should include breast self-examination. Any changes detected, no matter how small, should be reported to your caregiver.  After age 40, you should have a clinical breast exam (CBE) every year.  Starting at age 40, you should consider having a mammogram (breast X-ray) every year.  If you have a family history of breast cancer, talk to your caregiver about genetic screening.  If you are at high risk for breast cancer, talk to your caregiver about having an MRI and a mammogram every year.  Intestinal or Stomach Cancer: Tests to consider are a rectal exam, fecal occult blood, sigmoidoscopy, and colonoscopy. Women who are high risk may need to be screened at an earlier age and more often.  Cervical Cancer:  Beginning at age 30, you should have a Pap test every 3 years as long as the past 3 Pap tests have been normal.  If you have had past treatment for cervical cancer or a condition that could lead to cancer, you need Pap tests and screening for cancer for at least 20 years after your treatment.  If you had a hysterectomy for a problem that was not cancer or a condition that could lead to cancer, then you no longer need Pap tests.    If you are between ages 65 and 70, and you have had normal Pap tests going back 10 years, you no longer need Pap tests.  If Pap tests have been discontinued, risk factors (such as a new sexual partner) need to be reassessed to determine if screening should be resumed.  Some medical problems can increase the chance of getting cervical cancer. In these  cases, your caregiver may recommend more frequent screening and Pap tests.  Uterine Cancer: If you have vaginal bleeding after reaching menopause, you should notify your caregiver.  Ovarian Cancer: Other than yearly pelvic exams, there are no reliable tests available to screen for ovarian cancer at this time except for yearly pelvic exams.  Lung Cancer: Yearly chest X-rays can detect lung cancer and should be done on high risk women, such as cigarette smokers and women with chronic lung disease (emphysema).  Skin Cancer: A complete body skin exam should be done at your yearly examination. Avoid overexposure to the sun and ultraviolet light lamps. Use a strong sun block cream when in the sun. All of these things are important for lowering the risk of skin cancer. MENOPAUSE Menopause Symptoms: Hormone therapy products are effective for treating symptoms associated with menopause:  Moderate to severe hot flashes.  Night sweats.  Mood swings.  Headaches.  Tiredness.  Loss of sex drive.  Insomnia.  Other symptoms. Hormone replacement carries certain risks, especially in older women. Women who use or are thinking about using estrogen or estrogen with progestin treatments should discuss that with their caregiver. Your caregiver will help you understand the benefits and risks. The ideal dose of hormone replacement therapy is not known. The Food and Drug Administration (FDA) has concluded that hormone therapy should be used only at the lowest doses and for the shortest amount of time to reach treatment goals.  OSTEOPOROSIS Protecting Against Bone Loss and Preventing Fracture If you use hormone therapy for prevention of bone loss (osteoporosis), the risks for bone loss must outweigh the risk of the therapy. Ask your caregiver about other medications known to be safe and effective for preventing bone loss and fractures. To guard against bone loss or fractures, the following is recommended:  If  you are younger than age 50, take 1000 mg of calcium and at least 600 mg of Vitamin D per day.  If you are older than age 50 but younger than age 70, take 1200 mg of calcium and at least 600 mg of Vitamin D per day.  If you are older than age 70, take 1200 mg of calcium and at least 800 mg of Vitamin D per day. Smoking and excessive alcohol intake increases the risk of osteoporosis. Eat foods rich in calcium and vitamin D and do weight bearing exercises several times a week as your caregiver suggests. DIABETES Diabetes Mellitus: If you have type I or type 2 diabetes, you should keep your blood sugar under control with diet, exercise, and recommended medication. Avoid starchy and fatty foods, and too many sweets. Being overweight can make diabetes control more difficult. COGNITION AND MEMORY Cognition and Memory: Menopausal hormone therapy is not recommended for the prevention of cognitive disorders such as Alzheimer's disease or memory loss.  DEPRESSION  Depression may occur at any age, but it is common in elderly women. This may be because of physical, medical, social (loneliness), or financial problems and needs. If you are experiencing depression because of medical problems and control of symptoms, talk to your caregiver about this. Physical   activity and exercise may help with mood and sleep. Community and volunteer involvement may improve your sense of value and worth. If you have depression and you feel that the problem is getting worse or becoming severe, talk to your caregiver about which treatment options are best for you. ACCIDENTS  Accidents are common and can be serious in elderly woman. Prepare your house to prevent accidents. Eliminate throw rugs, place hand bars in bath, shower, and toilet areas. Avoid wearing high heeled shoes or walking on wet, snowy, and icy areas. Limit or stop driving if you have vision or hearing problems, or if you feel you are unsteady with your movements and  reflexes. HEPATITIS C Hepatitis C is a type of viral infection affecting the liver. It is spread mainly through contact with blood from an infected person. It can be treated, but if left untreated, it can lead to severe liver damage over the years. Many people who are infected do not know that the virus is in their blood. If you are a "baby-boomer", it is recommended that you have one screening test for Hepatitis C. IMMUNIZATIONS  Several immunizations are important to consider having during your senior years, including:   Tetanus, diphtheria, and pertussis booster shot.  Influenza every year before the flu season begins.  Pneumonia vaccine.  Shingles vaccine.  Others, as indicated based on your specific needs. Talk to your caregiver about these. Document Released: 11/08/2005 Document Revised: 01/31/2014 Document Reviewed: 07/04/2008 [employer] Patient Information 2015 [employer]. This information is not intended to replace advice given to you by your health care provider. Make sure you discuss any questions you have with your health care provider.  

## 2015-03-30 LAB — URINALYSIS W MICROSCOPIC + REFLEX CULTURE
BACTERIA UA: NONE SEEN
Bilirubin Urine: NEGATIVE
Casts: NONE SEEN
Glucose, UA: NEGATIVE mg/dL
Hgb urine dipstick: NEGATIVE
KETONES UR: NEGATIVE mg/dL
Leukocytes, UA: NEGATIVE
NITRITE: NEGATIVE
Protein, ur: NEGATIVE mg/dL
SPECIFIC GRAVITY, URINE: 1.022 (ref 1.005–1.030)
Squamous Epithelial / LPF: NONE SEEN
Urobilinogen, UA: 0.2 mg/dL (ref 0.0–1.0)
pH: 5 (ref 5.0–8.0)

## 2015-10-11 ENCOUNTER — Other Ambulatory Visit: Payer: Self-pay | Admitting: Gastroenterology

## 2015-10-11 DIAGNOSIS — R1084 Generalized abdominal pain: Secondary | ICD-10-CM

## 2015-10-11 DIAGNOSIS — R110 Nausea: Secondary | ICD-10-CM

## 2015-10-27 ENCOUNTER — Other Ambulatory Visit (HOSPITAL_COMMUNITY): Payer: Managed Care, Other (non HMO)

## 2015-10-27 ENCOUNTER — Ambulatory Visit (HOSPITAL_COMMUNITY): Payer: Managed Care, Other (non HMO)

## 2015-10-30 ENCOUNTER — Ambulatory Visit (HOSPITAL_COMMUNITY)
Admission: RE | Admit: 2015-10-30 | Discharge: 2015-10-30 | Disposition: A | Discharge status: Home / Self Care | Payer: 59 | Source: Ambulatory Visit | Attending: Gastroenterology | Admitting: Gastroenterology

## 2015-10-30 ENCOUNTER — Encounter (HOSPITAL_COMMUNITY)
Admission: RE | Admit: 2015-10-30 | Discharge: 2015-10-30 | Disposition: A | Discharge status: Home / Self Care | Payer: 59 | Source: Ambulatory Visit | Attending: Gastroenterology | Admitting: Gastroenterology

## 2015-10-30 DIAGNOSIS — R1084 Generalized abdominal pain: Secondary | ICD-10-CM

## 2015-10-30 DIAGNOSIS — R109 Unspecified abdominal pain: Secondary | ICD-10-CM | POA: Insufficient documentation

## 2015-10-30 DIAGNOSIS — R110 Nausea: Secondary | ICD-10-CM

## 2015-10-30 MED ORDER — TECHNETIUM TC 99M MEBROFENIN IV KIT
5.3000 | PACK | Freq: Once | INTRAVENOUS | Status: AC | PRN
  Administered 2015-10-30: 5.3 via INTRAVENOUS

## 2016-01-11 ENCOUNTER — Other Ambulatory Visit: Payer: Self-pay

## 2016-01-11 DIAGNOSIS — Z1231 Encounter for screening mammogram for malignant neoplasm of breast: Secondary | ICD-10-CM

## 2016-01-30 ENCOUNTER — Ambulatory Visit
Admission: RE | Admit: 2016-01-30 | Discharge: 2016-01-30 | Disposition: A | Discharge status: Home / Self Care | Payer: 59 | Source: Ambulatory Visit

## 2016-01-30 DIAGNOSIS — Z1231 Encounter for screening mammogram for malignant neoplasm of breast: Secondary | ICD-10-CM

## 2016-04-04 ENCOUNTER — Encounter: Payer: Self-pay | Admitting: Women's Health

## 2016-04-04 ENCOUNTER — Ambulatory Visit (INDEPENDENT_AMBULATORY_CARE_PROVIDER_SITE_OTHER): Payer: 59 | Admitting: Women's Health

## 2016-04-04 VITALS — BP 124/82 | Ht 62.0 in | Wt 148.0 lb

## 2016-04-04 DIAGNOSIS — Z01419 Encounter for gynecological examination (general) (routine) without abnormal findings: Secondary | ICD-10-CM

## 2016-04-04 NOTE — Patient Instructions (Addendum)

## 2016-04-04 NOTE — Progress Notes (Signed)
Kelsey [name] [birth year] [id number]    History:    Presents for annual exam.  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  Menopausal symptoms, has not yet tried Vitamin E.  History of multiple pregnancy losses 1 adopted daughter.  Normal mammogram history.  2013 DEXA with T score of -1.7 at femoral neck with FRAX 4.9%/0.5%.  2014 colonoscopy benign polyps, history of diverticulosis with abscess.  Reports epigastric abdominal pain today, has had intermittent episodes of pain with vomiting for several years, is being evaluated by Dr. Collene [name].  Diagnosed with UTI last week at medical center in [facility] after experiencing increased urinary frequency and hematuria, currently treating with Macrobid.  Past medical history, past surgical history, family history and social history were all reviewed and documented in the EPIC chart.  Retired Print production planner.  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  Walks 3 miles a day.  Recent trip to [place] and [place], [place] upcoming trip with husband to [place].  ROS:  A ROS was performed and pertinent positives and negatives are included.  Exam:  Filed Vitals:   04/04/16 1009  BP: 124/82    General appearance:  Normal Thyroid:  Symmetrical, normal in size, without palpable masses or nodularity. Respiratory  Auscultation:  Clear without wheezing or rhonchi Cardiovascular  Auscultation:  Regular rate, without rubs, murmurs or gallops  Edema/varicosities:  Not grossly evident Abdominal  Soft,nontender, without masses, guarding or rebound.  Liver/spleen:  No organomegaly noted  Hernia:  None appreciated  Skin  Inspection:  Grossly normal.   Breasts: Examined lying and sitting.     Right: Without masses, retractions, discharge or axillary adenopathy.     Left: Without masses, retractions, discharge or axillary adenopathy. Gentitourinary   Inguinal/mons:  Normal without inguinal adenopathy  External genitalia:   Normal  BUS/Urethra/Skene's glands:  Normal  Vagina:  Normal  Cervix:  Absent  Uterus:  Absent  Adnexa/parametria:     Rt: Without masses or tenderness.   Lt: Without masses or tenderness.  Anus and perineum: Normal  Digital rectal exam: Normal sphincter tone without palpated masses or tenderness  Assessment/Plan:  55 y.o. MWF G9P0 + 1 adopted for annual exam with complaints of stomach pain and UTI.  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 Collene [name] managing UTI - currently on Macrobid per MD in [facility] and meds managed by primary care physician Osteopenia without elevated FRAX  Plan: SBE's, annual 3D mammogram, history of dense breasts.  Encouraged to continue regular exercise and healthy diet, Vitamin D.  Discussed contraindication for HRT due to prior history of DVT, declines Effexor at this time, will try Vitamin E BID.  Follow-up with Dr. Collene [name] for further evaluation of abdominal pain.  Instructed to call if UTI symptoms do not resolve.  Schedule DEXA scan for this year.    Kelsey [name] [facility], 10:54 AM 04/04/2016

## 2016-04-18 ENCOUNTER — Other Ambulatory Visit: Payer: Self-pay | Admitting: Women's Health

## 2016-04-18 ENCOUNTER — Other Ambulatory Visit: Payer: 59

## 2016-04-18 ENCOUNTER — Telehealth: Payer: Self-pay | Admitting: *Deleted

## 2016-04-18 DIAGNOSIS — R350 Frequency of micturition: Secondary | ICD-10-CM

## 2016-04-18 DIAGNOSIS — N3000 Acute cystitis without hematuria: Secondary | ICD-10-CM

## 2016-04-18 LAB — URINALYSIS W MICROSCOPIC + REFLEX CULTURE
BILIRUBIN URINE: NEGATIVE
CRYSTALS: NONE SEEN [HPF]
Casts: NONE SEEN [LPF]
GLUCOSE, UA: NEGATIVE
Hgb urine dipstick: NEGATIVE
Ketones, ur: NEGATIVE
NITRITE: NEGATIVE
PH: 5 (ref 5.0–8.0)
PROTEIN: NEGATIVE
RBC / HPF: NONE SEEN RBC/HPF (ref <=2)
SPECIFIC GRAVITY, URINE: 1.015 (ref 1.001–1.035)
Yeast: NONE SEEN [HPF]

## 2016-04-18 MED ORDER — CIPROFLOXACIN HCL 250 MG PO TABS: 250.0000 mg | ORAL_TABLET | Freq: Two times a day (BID) | ORAL | Status: DC

## 2016-04-18 NOTE — Telephone Encounter (Signed)
Phone call, reviewed urine culture was negative in June, best to check a UA. Will come by today.

## 2016-04-18 NOTE — Progress Notes (Signed)
Complained of increased urinary frequency, burning and questionable return of UTI. Denies vaginal discharge or fever. UA and urine culture -02/2016  UA 2+ leukocytes, 20-40 WBCs, moderate bacteria, 0-5 squamous epithelials  UTI  Cipro 250 twice daily for 3 days #6 instructed to call if no relief of symptoms

## 2016-04-18 NOTE — Telephone Encounter (Signed)
Pt was treated for UTI on Macrobid per MD in [facility] states she doesn't feel 100%, has urgency and little urine output with urination at times, told to call if not better. No burning with urination.  Please advise

## 2016-04-20 LAB — URINE CULTURE

## 2016-04-30 ENCOUNTER — Other Ambulatory Visit: Payer: Self-pay | Admitting: Women's Health

## 2016-04-30 ENCOUNTER — Other Ambulatory Visit: Payer: Self-pay | Admitting: Gynecology

## 2016-04-30 ENCOUNTER — Ambulatory Visit (INDEPENDENT_AMBULATORY_CARE_PROVIDER_SITE_OTHER): Payer: 59

## 2016-04-30 DIAGNOSIS — Z13820 Encounter for screening for osteoporosis: Secondary | ICD-10-CM

## 2016-04-30 DIAGNOSIS — M899 Disorder of bone, unspecified: Secondary | ICD-10-CM

## 2016-04-30 DIAGNOSIS — M8580 Other specified disorders of bone density and structure, unspecified site: Secondary | ICD-10-CM

## 2017-01-13 ENCOUNTER — Other Ambulatory Visit: Payer: Self-pay | Admitting: Women's Health

## 2017-01-13 DIAGNOSIS — Z1231 Encounter for screening mammogram for malignant neoplasm of breast: Secondary | ICD-10-CM

## 2017-02-03 ENCOUNTER — Ambulatory Visit
Admission: RE | Admit: 2017-02-03 | Discharge: 2017-02-03 | Disposition: A | Discharge status: Home / Self Care | Payer: 59 | Source: Ambulatory Visit | Attending: Women's Health | Admitting: Women's Health

## 2017-02-03 ENCOUNTER — Ambulatory Visit: Payer: Managed Care, Other (non HMO)

## 2017-02-03 DIAGNOSIS — Z1231 Encounter for screening mammogram for malignant neoplasm of breast: Secondary | ICD-10-CM

## 2017-02-12 ENCOUNTER — Encounter: Payer: Self-pay | Admitting: Gynecology

## 2017-04-23 ENCOUNTER — Encounter: Payer: Self-pay | Admitting: Women's Health

## 2017-04-23 ENCOUNTER — Ambulatory Visit (INDEPENDENT_AMBULATORY_CARE_PROVIDER_SITE_OTHER): Payer: 59 | Admitting: Women's Health

## 2017-04-23 VITALS — BP 120/76 | Ht 63.0 in | Wt 147.0 lb

## 2017-04-23 DIAGNOSIS — R350 Frequency of micturition: Secondary | ICD-10-CM

## 2017-04-23 DIAGNOSIS — N898 Other specified noninflammatory disorders of vagina: Secondary | ICD-10-CM

## 2017-04-23 DIAGNOSIS — Z01419 Encounter for gynecological examination (general) (routine) without abnormal findings: Secondary | ICD-10-CM | POA: Diagnosis not present

## 2017-04-23 MED ORDER — PHENAZOPYRIDINE HCL 200 MG PO TABS: 200.0000 mg | ORAL_TABLET | Freq: Three times a day (TID) | ORAL | 0 refills | Status: DC | PRN

## 2017-04-23 MED ORDER — ESTRADIOL 10 MCG VA TABS: ORAL_TABLET | VAGINAL | 4 refills | Status: DC

## 2017-04-23 NOTE — Progress Notes (Signed)
Kelsey [name] [birth year] [id number]    History:    Presents for annual exam.  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  Normal Pap and mammogram history. 2014 benign colon polyp. 2017 T score -2.3 left femoral neck, -1.7 right femoral neck  FRAX 5.5%/0.3%. History of a DVT. History of numerous SABs has one adopted daughter.  Past medical history, past surgical history, family history and social history were all reviewed and documented in the EPIC chart. Retired Print production planner. Kelsey [name], structural in [place] .  ROS:  A ROS was performed and pertinent positives and negatives are included.  Exam:  Vitals:   04/23/17 1036  BP: 120/76  Weight: 147 lb (66.7 kg)  Height: 5\' 3"  (1.6 m)   Body mass index is 26.04 kg/m.   General appearance:  Normal Thyroid:  Symmetrical, normal in size, without palpable masses or nodularity. Respiratory  Auscultation:  Clear without wheezing or rhonchi Cardiovascular  Auscultation:  Regular rate, without rubs, murmurs or gallops  Edema/varicosities:  Not grossly evident Abdominal  Soft,nontender, without masses, guarding or rebound.  Liver/spleen:  No organomegaly noted  Hernia:  None appreciated  Skin  Inspection:  Grossly normal   Breasts: Examined lying and sitting.     Right: Without masses, retractions, discharge or axillary adenopathy.     Left: Without masses, retractions, discharge or axillary adenopathy. Gentitourinary   Inguinal/mons:  Normal without inguinal adenopathy  External genitalia:  Normal  BUS/Urethra/Skene's glands:  Normal  Vagina:  Atrophic  Cervix:  And uterus absent  Adnexa/parametria:     Rt: Without masses or tenderness.   Lt: Without masses or tenderness.  Anus and perineum: Normal  Digital rectal exam: Normal sphincter tone without palpated masses or tenderness  Assessment/Plan:  56 y.o. MWF G10 P0  + 1 adopted for annual exam with complaint of mild urinary frequency without pain at end of stream of urination.  1999 TVH with  LSO for DUB with vaginal atrophy Osteopenia without elevated FRAX Benign colon polyps follow-up one year Labs-primary care  Plan:  SBEs, continue annual screening mammogram. Reviewed importance of increasing regular exercise, calcium rich diet, vitamin D 2000 daily encouraged. Home safety, fall prevention and importance of weightbearing exercise reviewed. Options for vaginal dryness reviewed will try Vagifem one applicator at bedtime for 2 weeks and then twice weekly thereafter. Prescription, proper use given and reviewed, vaginal lubricants encouraged. Instructed to call if no relief. UA, having mild frequency.   [place], 11:00 AM 04/23/2017

## 2017-04-23 NOTE — Patient Instructions (Signed)
Health Maintenance for Postmenopausal Women Menopause is a normal process in which your reproductive ability comes to an end. This process happens gradually over a span of months to years, usually between the ages of 22 and 9. Menopause is complete when you have missed 12 consecutive menstrual periods. It is important to talk with your health care provider about some of the most common conditions that affect postmenopausal women, such as heart disease, cancer, and bone loss (osteoporosis). Adopting a healthy lifestyle and getting preventive care can help to promote your health and wellness. Those actions can also lower your chances of developing some of these common conditions. What should I know about menopause? During menopause, you may experience a number of symptoms, such as:  Moderate-to-severe hot flashes.  Night sweats.  Decrease in sex drive.  Mood swings.  Headaches.  Tiredness.  Irritability.  Memory problems.  Insomnia.  Choosing to treat or not to treat menopausal changes is an individual decision that you make with your health care provider. What should I know about hormone replacement therapy and supplements? Hormone therapy products are effective for treating symptoms that are associated with menopause, such as hot flashes and night sweats. Hormone replacement carries certain risks, especially as you become older. If you are thinking about using estrogen or estrogen with progestin treatments, discuss the benefits and risks with your health care provider. What should I know about heart disease and stroke? Heart disease, heart attack, and stroke become more likely as you age. This may be due, in part, to the hormonal changes that your body experiences during menopause. These can affect how your body processes dietary fats, triglycerides, and cholesterol. Heart attack and stroke are both medical emergencies. There are many things that you can do to help prevent heart disease  and stroke:  Have your blood pressure checked at least every 1-2 years. High blood pressure causes heart disease and increases the risk of stroke.  If you are 53-22 years old, ask your health care provider if you should take aspirin to prevent a heart attack or a stroke.  Do not use any tobacco products, including cigarettes, chewing tobacco, or electronic cigarettes. If you need help quitting, ask your health care provider.  It is important to eat a healthy diet and maintain a healthy weight. ? Be sure to include plenty of vegetables, fruits, low-fat dairy products, and lean protein. ? Avoid eating foods that are high in solid fats, added sugars, or salt (sodium).  Get regular exercise. This is one of the most important things that you can do for your health. ? Try to exercise for at least 150 minutes each week. The type of exercise that you do should increase your heart rate and make you sweat. This is known as moderate-intensity exercise. ? Try to do strengthening exercises at least twice each week. Do these in addition to the moderate-intensity exercise.  Know your numbers.Ask your health care provider to check your cholesterol and your blood glucose. Continue to have your blood tested as directed by your health care provider.  What should I know about cancer screening? There are several types of cancer. Take the following steps to reduce your risk and to catch any cancer development as early as possible. Breast Cancer  Practice breast self-awareness. ? This means understanding how your breasts normally appear and feel. ? It also means doing regular breast self-exams. Let your health care provider know about any changes, no matter how small.  If you are 40  or older, have a clinician do a breast exam (clinical breast exam or CBE) every year. Depending on your age, family history, and medical history, it may be recommended that you also have a yearly breast X-ray (mammogram).  If you  have a family history of breast cancer, talk with your health care provider about genetic screening.  If you are at high risk for breast cancer, talk with your health care provider about having an MRI and a mammogram every year.  Breast cancer (BRCA) gene test is recommended for women who have family members with BRCA-related cancers. Results of the assessment will determine the need for genetic counseling and BRCA1 and for BRCA2 testing. BRCA-related cancers include these types: ? Breast. This occurs in males or females. ? Ovarian. ? Tubal. This may also be called fallopian tube cancer. ? Cancer of the abdominal or pelvic lining (peritoneal cancer). ? Prostate. ? Pancreatic.  Cervical, Uterine, and Ovarian Cancer Your health care provider may recommend that you be screened regularly for cancer of the pelvic organs. These include your ovaries, uterus, and vagina. This screening involves a pelvic exam, which includes checking for microscopic changes to the surface of your cervix (Pap test).  For women ages 21-65, health care providers may recommend a pelvic exam and a Pap test every three years. For women ages 79-65, they may recommend the Pap test and pelvic exam, combined with testing for human papilloma virus (HPV), every five years. Some types of HPV increase your risk of cervical cancer. Testing for HPV may also be done on women of any age who have unclear Pap test results.  Other health care providers may not recommend any screening for nonpregnant women who are considered low risk for pelvic cancer and have no symptoms. Ask your health care provider if a screening pelvic exam is right for you.  If you have had past treatment for cervical cancer or a condition that could lead to cancer, you need Pap tests and screening for cancer for at least 20 years after your treatment. If Pap tests have been discontinued for you, your risk factors (such as having a new sexual partner) need to be  reassessed to determine if you should start having screenings again. Some women have medical problems that increase the chance of getting cervical cancer. In these cases, your health care provider may recommend that you have screening and Pap tests more often.  If you have a family history of uterine cancer or ovarian cancer, talk with your health care provider about genetic screening.  If you have vaginal bleeding after reaching menopause, tell your health care provider.  There are currently no reliable tests available to screen for ovarian cancer.  Lung Cancer Lung cancer screening is recommended for adults 69-62 years old who are at high risk for lung cancer because of a history of smoking. A yearly low-dose CT scan of the lungs is recommended if you:  Currently smoke.  Have a history of at least 30 pack-years of smoking and you currently smoke or have quit within the past 15 years. A pack-year is smoking an average of one pack of cigarettes per day for one year.  Yearly screening should:  Continue until it has been 15 years since you quit.  Stop if you develop a health problem that would prevent you from having lung cancer treatment.  Colorectal Cancer  This type of cancer can be detected and can often be prevented.  Routine colorectal cancer screening usually begins at  age 42 and continues through age 45.  If you have risk factors for colon cancer, your health care provider may recommend that you be screened at an earlier age.  If you have a family history of colorectal cancer, talk with your health care provider about genetic screening.  Your health care provider may also recommend using home test kits to check for hidden blood in your stool.  A small camera at the end of a tube can be used to examine your colon directly (sigmoidoscopy or colonoscopy). This is done to check for the earliest forms of colorectal cancer.  Direct examination of the colon should be repeated every  5-10 years until age 71. However, if early forms of precancerous polyps or small growths are found or if you have a family history or genetic risk for colorectal cancer, you may need to be screened more often.  Skin Cancer  Check your skin from head to toe regularly.  Monitor any moles. Be sure to tell your health care provider: ? About any new moles or changes in moles, especially if there is a change in a mole's shape or color. ? If you have a mole that is larger than the size of a pencil eraser.  If any of your family members has a history of skin cancer, especially at a Tiandra Swoveland age, talk with your health care provider about genetic screening.  Always use sunscreen. Apply sunscreen liberally and repeatedly throughout the day.  Whenever you are outside, protect yourself by wearing long sleeves, pants, a wide-brimmed hat, and sunglasses.  What should I know about osteoporosis? Osteoporosis is a condition in which bone destruction happens more quickly than new bone creation. After menopause, you may be at an increased risk for osteoporosis. To help prevent osteoporosis or the bone fractures that can happen because of osteoporosis, the following is recommended:  If you are 46-71 years old, get at least 1,000 mg of calcium and at least 600 mg of vitamin D per day.  If you are older than age 55 but younger than age 65, get at least 1,200 mg of calcium and at least 600 mg of vitamin D per day.  If you are older than age 54, get at least 1,200 mg of calcium and at least 800 mg of vitamin D per day.  Smoking and excessive alcohol intake increase the risk of osteoporosis. Eat foods that are rich in calcium and vitamin D, and do weight-bearing exercises several times each week as directed by your health care provider. What should I know about how menopause affects my mental health? Depression may occur at any age, but it is more common as you become older. Common symptoms of depression  include:  Low or sad mood.  Changes in sleep patterns.  Changes in appetite or eating patterns.  Feeling an overall lack of motivation or enjoyment of activities that you previously enjoyed.  Frequent crying spells.  Talk with your health care provider if you think that you are experiencing depression. What should I know about immunizations? It is important that you get and maintain your immunizations. These include:  Tetanus, diphtheria, and pertussis (Tdap) booster vaccine.  Influenza every year before the flu season begins.  Pneumonia vaccine.  Shingles vaccine.  Your health care provider may also recommend other immunizations. This information is not intended to replace advice given to you by your health care provider. Make sure you discuss any questions you have with your health care provider. Document Released: 11/08/2005  Document Revised: 04/05/2016 Document Reviewed: 06/20/2015 Elsevier Interactive Patient Education  2018 Elsevier Inc.  

## 2017-04-24 LAB — URINALYSIS W MICROSCOPIC + REFLEX CULTURE
BILIRUBIN URINE: NEGATIVE
Bacteria, UA: NONE SEEN [HPF]
CASTS: NONE SEEN [LPF]
Crystals: NONE SEEN [HPF]
GLUCOSE, UA: NEGATIVE
HGB URINE DIPSTICK: NEGATIVE
KETONES UR: NEGATIVE
NITRITE: NEGATIVE
Specific Gravity, Urine: 1.023 (ref 1.001–1.035)
Squamous Epithelial / LPF: NONE SEEN [HPF] (ref <=5)
YEAST: NONE SEEN [HPF]
pH: 5 (ref 5.0–8.0)

## 2017-04-25 LAB — URINE CULTURE: Organism ID, Bacteria: NO GROWTH

## 2018-01-08 ENCOUNTER — Other Ambulatory Visit: Payer: Self-pay | Admitting: Women's Health

## 2018-01-08 DIAGNOSIS — Z1231 Encounter for screening mammogram for malignant neoplasm of breast: Secondary | ICD-10-CM

## 2018-02-05 ENCOUNTER — Ambulatory Visit
Admission: RE | Admit: 2018-02-05 | Discharge: 2018-02-05 | Disposition: A | Discharge status: Home / Self Care | Payer: 59 | Source: Ambulatory Visit | Attending: Women's Health | Admitting: Women's Health

## 2018-02-05 DIAGNOSIS — Z1231 Encounter for screening mammogram for malignant neoplasm of breast: Secondary | ICD-10-CM

## 2018-06-16 ENCOUNTER — Encounter: Payer: Self-pay | Admitting: Women's Health

## 2018-06-16 ENCOUNTER — Ambulatory Visit (INDEPENDENT_AMBULATORY_CARE_PROVIDER_SITE_OTHER): Payer: 59 | Admitting: Women's Health

## 2018-06-16 VITALS — BP 128/88 | Ht 62.4 in | Wt 144.8 lb

## 2018-06-16 DIAGNOSIS — Z01419 Encounter for gynecological examination (general) (routine) without abnormal findings: Secondary | ICD-10-CM | POA: Diagnosis not present

## 2018-06-16 DIAGNOSIS — Z13820 Encounter for screening for osteoporosis: Secondary | ICD-10-CM | POA: Diagnosis not present

## 2018-06-16 MED ORDER — HYDROCORTISONE ACE-PRAMOXINE 2.5-1 % RE CREA: 1.0000 "application " | TOPICAL_CREAM | Freq: Three times a day (TID) | RECTAL | 3 refills | Status: DC

## 2018-06-16 NOTE — Progress Notes (Signed)
Kelsey [name] 1961/09/01 [id number]    History:    Presents for annual exam.  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  Normal Pap and mammogram history.  Numerous pregnancy losses 1 adopted daughter.  2014- colon polyp.  History of DVT.  2017-1.7, FRAX 5.5% / 0.3%.  Gilbert's disease, primary care manages labs.  History of a diverticular abscess.  Past medical history, past surgical history, family history and social history were all reviewed and documented in the EPIC chart.  Substitute teacher at preschool.  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  Bilateral clubfeet.  ROS:  A ROS was performed and pertinent positives and negatives are included.  Exam:  Vitals:   06/16/18 1205  BP: 128/88  Weight: 144 lb 12.8 oz (65.7 kg)  Height: 5' 2.4" (1.585 m)   Body mass index is 26.15 kg/m.   General appearance:  Normal Thyroid:  Symmetrical, normal in size, without palpable masses or nodularity. Respiratory  Auscultation:  Clear without wheezing or rhonchi Cardiovascular  Auscultation:  Regular rate, without rubs, murmurs or gallops  Edema/varicosities:  Not grossly evident Abdominal  Soft,nontender, without masses, guarding or rebound.  Liver/spleen:  No organomegaly noted  Hernia:  None appreciated  Skin  Inspection:  Grossly normal   Breasts: Examined lying and sitting.     Right: Without masses, retractions, discharge or axillary adenopathy.     Left: Without masses, retractions, discharge or axillary adenopathy. Gentitourinary   Inguinal/mons:  Normal without inguinal adenopathy  External genitalia:  Normal  BUS/Urethra/Skene's glands:  Normal  Vagina:  Normal  Cervix: And uterus absent  Adnexa/parametria:     Rt: Without masses or tenderness.   Lt: Without masses or tenderness.  Anus and perineum: Normal numerous hemorrhoids  Digital rectal exam: Normal sphincter tone without palpated masses or tenderness  Assessment/Plan:  57 y.o. MWF G 10 P0 +1 adopted for annual exam complaint  of urinary frequency.  1999 TVH with LSO on no HRT History of DVT Hemorrhoids Osteopenia without elevated FRAX Labs-primary care  Pain: Analpram HC 2.5/1% rectal cream prescription, proper use given and reviewed.  SBE's, continue annual screening mammogram, calcium rich foods, vitamin D 2000 daily encouraged.  Repeat DEXA.  Home safety, fall prevention and importance of balance type exercise, yoga encouraged.  UA pending.    [name] [facility], 1:14 PM 06/16/2018

## 2018-06-16 NOTE — Patient Instructions (Signed)
Health Maintenance for Postmenopausal Women Menopause is a normal process in which your reproductive ability comes to an end. This process happens gradually over a span of months to years, usually between the ages of 22 and 9. Menopause is complete when you have missed 12 consecutive menstrual periods. It is important to talk with your health care provider about some of the most common conditions that affect postmenopausal women, such as heart disease, cancer, and bone loss (osteoporosis). Adopting a healthy lifestyle and getting preventive care can help to promote your health and wellness. Those actions can also lower your chances of developing some of these common conditions. What should I know about menopause? During menopause, you may experience a number of symptoms, such as:  Moderate-to-severe hot flashes.  Night sweats.  Decrease in sex drive.  Mood swings.  Headaches.  Tiredness.  Irritability.  Memory problems.  Insomnia.  Choosing to treat or not to treat menopausal changes is an individual decision that you make with your health care provider. What should I know about hormone replacement therapy and supplements? Hormone therapy products are effective for treating symptoms that are associated with menopause, such as hot flashes and night sweats. Hormone replacement carries certain risks, especially as you become older. If you are thinking about using estrogen or estrogen with progestin treatments, discuss the benefits and risks with your health care provider. What should I know about heart disease and stroke? Heart disease, heart attack, and stroke become more likely as you age. This may be due, in part, to the hormonal changes that your body experiences during menopause. These can affect how your body processes dietary fats, triglycerides, and cholesterol. Heart attack and stroke are both medical emergencies. There are many things that you can do to help prevent heart disease  and stroke:  Have your blood pressure checked at least every 1-2 years. High blood pressure causes heart disease and increases the risk of stroke.  If you are 53-22 years old, ask your health care provider if you should take aspirin to prevent a heart attack or a stroke.  Do not use any tobacco products, including cigarettes, chewing tobacco, or electronic cigarettes. If you need help quitting, ask your health care provider.  It is important to eat a healthy diet and maintain a healthy weight. ? Be sure to include plenty of vegetables, fruits, low-fat dairy products, and lean protein. ? Avoid eating foods that are high in solid fats, added sugars, or salt (sodium).  Get regular exercise. This is one of the most important things that you can do for your health. ? Try to exercise for at least 150 minutes each week. The type of exercise that you do should increase your heart rate and make you sweat. This is known as moderate-intensity exercise. ? Try to do strengthening exercises at least twice each week. Do these in addition to the moderate-intensity exercise.  Know your numbers.Ask your health care provider to check your cholesterol and your blood glucose. Continue to have your blood tested as directed by your health care provider.  What should I know about cancer screening? There are several types of cancer. Take the following steps to reduce your risk and to catch any cancer development as early as possible. Breast Cancer  Practice breast self-awareness. ? This means understanding how your breasts normally appear and feel. ? It also means doing regular breast self-exams. Let your health care provider know about any changes, no matter how small.  If you are 40  or older, have a clinician do a breast exam (clinical breast exam or CBE) every year. Depending on your age, family history, and medical history, it may be recommended that you also have a yearly breast X-ray (mammogram).  If you  have a family history of breast cancer, talk with your health care provider about genetic screening.  If you are at high risk for breast cancer, talk with your health care provider about having an MRI and a mammogram every year.  Breast cancer (BRCA) gene test is recommended for women who have family members with BRCA-related cancers. Results of the assessment will determine the need for genetic counseling and BRCA1 and for BRCA2 testing. BRCA-related cancers include these types: ? Breast. This occurs in males or females. ? Ovarian. ? Tubal. This may also be called fallopian tube cancer. ? Cancer of the abdominal or pelvic lining (peritoneal cancer). ? Prostate. ? Pancreatic.  Cervical, Uterine, and Ovarian Cancer Your health care provider may recommend that you be screened regularly for cancer of the pelvic organs. These include your ovaries, uterus, and vagina. This screening involves a pelvic exam, which includes checking for microscopic changes to the surface of your cervix (Pap test).  For women ages 21-65, health care providers may recommend a pelvic exam and a Pap test every three years. For women ages 79-65, they may recommend the Pap test and pelvic exam, combined with testing for human papilloma virus (HPV), every five years. Some types of HPV increase your risk of cervical cancer. Testing for HPV may also be done on women of any age who have unclear Pap test results.  Other health care providers may not recommend any screening for nonpregnant women who are considered low risk for pelvic cancer and have no symptoms. Ask your health care provider if a screening pelvic exam is right for you.  If you have had past treatment for cervical cancer or a condition that could lead to cancer, you need Pap tests and screening for cancer for at least 20 years after your treatment. If Pap tests have been discontinued for you, your risk factors (such as having a new sexual partner) need to be  reassessed to determine if you should start having screenings again. Some women have medical problems that increase the chance of getting cervical cancer. In these cases, your health care provider may recommend that you have screening and Pap tests more often.  If you have a family history of uterine cancer or ovarian cancer, talk with your health care provider about genetic screening.  If you have vaginal bleeding after reaching menopause, tell your health care provider.  There are currently no reliable tests available to screen for ovarian cancer.  Lung Cancer Lung cancer screening is recommended for adults 69-62 years old who are at high risk for lung cancer because of a history of smoking. A yearly low-dose CT scan of the lungs is recommended if you:  Currently smoke.  Have a history of at least 30 pack-years of smoking and you currently smoke or have quit within the past 15 years. A pack-year is smoking an average of one pack of cigarettes per day for one year.  Yearly screening should:  Continue until it has been 15 years since you quit.  Stop if you develop a health problem that would prevent you from having lung cancer treatment.  Colorectal Cancer  This type of cancer can be detected and can often be prevented.  Routine colorectal cancer screening usually begins at  age 42 and continues through age 45.  If you have risk factors for colon cancer, your health care provider may recommend that you be screened at an earlier age.  If you have a family history of colorectal cancer, talk with your health care provider about genetic screening.  Your health care provider may also recommend using home test kits to check for hidden blood in your stool.  A small camera at the end of a tube can be used to examine your colon directly (sigmoidoscopy or colonoscopy). This is done to check for the earliest forms of colorectal cancer.  Direct examination of the colon should be repeated every  5-10 years until age 71. However, if early forms of precancerous polyps or small growths are found or if you have a family history or genetic risk for colorectal cancer, you may need to be screened more often.  Skin Cancer  Check your skin from head to toe regularly.  Monitor any moles. Be sure to tell your health care provider: ? About any new moles or changes in moles, especially if there is a change in a mole's shape or color. ? If you have a mole that is larger than the size of a pencil eraser.  If any of your family members has a history of skin cancer, especially at a young age, talk with your health care provider about genetic screening.  Always use sunscreen. Apply sunscreen liberally and repeatedly throughout the day.  Whenever you are outside, protect yourself by wearing long sleeves, pants, a wide-brimmed hat, and sunglasses.  What should I know about osteoporosis? Osteoporosis is a condition in which bone destruction happens more quickly than new bone creation. After menopause, you may be at an increased risk for osteoporosis. To help prevent osteoporosis or the bone fractures that can happen because of osteoporosis, the following is recommended:  If you are 46-71 years old, get at least 1,000 mg of calcium and at least 600 mg of vitamin D per day.  If you are older than age 55 but younger than age 65, get at least 1,200 mg of calcium and at least 600 mg of vitamin D per day.  If you are older than age 54, get at least 1,200 mg of calcium and at least 800 mg of vitamin D per day.  Smoking and excessive alcohol intake increase the risk of osteoporosis. Eat foods that are rich in calcium and vitamin D, and do weight-bearing exercises several times each week as directed by your health care provider. What should I know about how menopause affects my mental health? Depression may occur at any age, but it is more common as you become older. Common symptoms of depression  include:  Low or sad mood.  Changes in sleep patterns.  Changes in appetite or eating patterns.  Feeling an overall lack of motivation or enjoyment of activities that you previously enjoyed.  Frequent crying spells.  Talk with your health care provider if you think that you are experiencing depression. What should I know about immunizations? It is important that you get and maintain your immunizations. These include:  Tetanus, diphtheria, and pertussis (Tdap) booster vaccine.  Influenza every year before the flu season begins.  Pneumonia vaccine.  Shingles vaccine.  Your health care provider may also recommend other immunizations. This information is not intended to replace advice given to you by your health care provider. Make sure you discuss any questions you have with your health care provider. Document Released: 11/08/2005  Document Revised: 04/05/2016 Document Reviewed: 06/20/2015 Elsevier Interactive Patient Education  2018 Elsevier Inc.  

## 2018-06-18 LAB — URINALYSIS, COMPLETE W/RFL CULTURE
BACTERIA UA: NONE SEEN /HPF
BILIRUBIN URINE: NEGATIVE
Glucose, UA: NEGATIVE
Hgb urine dipstick: NEGATIVE
Hyaline Cast: NONE SEEN /LPF
KETONES UR: NEGATIVE
Nitrites, Initial: NEGATIVE
RBC / HPF: NONE SEEN /HPF (ref 0–2)
SPECIFIC GRAVITY, URINE: 1.017 (ref 1.001–1.03)
SQUAMOUS EPITHELIAL / LPF: NONE SEEN /HPF (ref <=5)
WBC, UA: NONE SEEN /HPF (ref 0–5)
pH: <=5 (ref 5.0–8.0)

## 2018-06-18 LAB — CULTURE INDICATED

## 2018-06-18 LAB — URINE CULTURE
MICRO NUMBER: 91119488
SPECIMEN QUALITY: ADEQUATE

## 2018-07-31 DIAGNOSIS — M8580 Other specified disorders of bone density and structure, unspecified site: Secondary | ICD-10-CM

## 2018-07-31 HISTORY — DX: Other specified disorders of bone density and structure, unspecified site: M85.80

## 2018-08-03 ENCOUNTER — Other Ambulatory Visit: Payer: Self-pay | Admitting: Gynecology

## 2018-08-03 ENCOUNTER — Ambulatory Visit (INDEPENDENT_AMBULATORY_CARE_PROVIDER_SITE_OTHER): Payer: 59

## 2018-08-03 DIAGNOSIS — M8589 Other specified disorders of bone density and structure, multiple sites: Secondary | ICD-10-CM

## 2018-08-03 DIAGNOSIS — Z13820 Encounter for screening for osteoporosis: Secondary | ICD-10-CM | POA: Diagnosis not present

## 2018-08-04 ENCOUNTER — Encounter: Payer: Self-pay | Admitting: Gynecology

## 2019-03-11 ENCOUNTER — Other Ambulatory Visit: Payer: Self-pay | Admitting: Women's Health

## 2019-03-11 DIAGNOSIS — Z1231 Encounter for screening mammogram for malignant neoplasm of breast: Secondary | ICD-10-CM

## 2019-04-29 ENCOUNTER — Ambulatory Visit
Admission: RE | Admit: 2019-04-29 | Discharge: 2019-04-29 | Disposition: A | Discharge status: Home / Self Care | Payer: 59 | Source: Ambulatory Visit | Attending: Women's Health | Admitting: Women's Health

## 2019-04-29 ENCOUNTER — Other Ambulatory Visit: Payer: Self-pay

## 2019-04-29 DIAGNOSIS — Z1231 Encounter for screening mammogram for malignant neoplasm of breast: Secondary | ICD-10-CM

## 2019-06-22 ENCOUNTER — Ambulatory Visit: Payer: 59 | Admitting: Women's Health

## 2019-06-22 ENCOUNTER — Other Ambulatory Visit: Payer: Self-pay

## 2019-06-22 ENCOUNTER — Encounter: Payer: Self-pay | Admitting: Women's Health

## 2019-06-22 VITALS — BP 120/76 | Ht 62.0 in | Wt 151.0 lb

## 2019-06-22 DIAGNOSIS — Z01419 Encounter for gynecological examination (general) (routine) without abnormal findings: Secondary | ICD-10-CM | POA: Diagnosis not present

## 2019-06-22 MED ORDER — HYDROCORT-PRAMOXINE (PERIANAL) 2.5-1 % EX CREA: TOPICAL_CREAM | Freq: Three times a day (TID) | CUTANEOUS | 6 refills | Status: DC

## 2019-06-22 NOTE — Progress Notes (Signed)
Kelsey [name] 09/06/1961 [id number]    History:    Presents for annual exam.  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  Normal Pap and mammogram history.  History of a DVT.  Multiple pregnancy losses has 1 adopted daughter [name] who lives in [place].  07/2018 DEXA T score -2 FRAX 8.7% / 1.1%.  2014 benign colon polyp 10-year follow-up.  History of a diverticular abscess.  Gilbert's disease.  Surgical correction of bilateral clubfeet.  Past medical history, past surgical history, family history and social history were all reviewed and documented in the EPIC chart.  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  Daughter planning marriage next year.  ROS:  A ROS was performed and pertinent positives and negatives are included.  Exam:  Vitals:   06/22/19 1155  BP: 120/76  Weight: 151 lb (68.5 kg)  Height: 5\' 2"  (1.575 m)   Body mass index is 27.62 kg/m.   General appearance:  Normal Thyroid:  Symmetrical, normal in size, without palpable masses or nodularity. Respiratory  Auscultation:  Clear without wheezing or rhonchi Cardiovascular  Auscultation:  Regular rate, without rubs, murmurs or gallops  Edema/varicosities:  Not grossly evident Abdominal  Soft,nontender, without masses, guarding or rebound.  Liver/spleen:  No organomegaly noted  Hernia:  None appreciated  Skin  Inspection:  Grossly normal   Breasts: Examined lying and sitting.     Right: Without masses, retractions, discharge or axillary adenopathy.     Left: Without masses, retractions, discharge or axillary adenopathy. Gentitourinary   Inguinal/mons:  Normal without inguinal adenopathy  External genitalia:  Normal  BUS/Urethra/Skene's glands:  Normal  Vagina:  Normal  Cervix: And uterus absent   Adnexa/parametria:     Rt: Without masses or tenderness.   Lt: Without masses or tenderness.  Anus and perineum: Normal/hemorrhoids  Digital rectal exam: Normal sphincter tone without palpated masses or  tenderness  Assessment/Plan:  58 y.o. MWF G 10 P0 +1 adopted for annual exam with no complaints.  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   History of a DVT Primary care manages labs  Plan: SBEs, continue annual screening mammogram, calcium rich foods, vitamin D 2000 daily encouraged.  Reviewed importance of weightbearing and balance type exercise encouraged yoga.  Home safety, fall prevention discussed.  Flu vaccine encouraged declined.  Refill of Analpram given uses occasionally.    [place], 1:35 PM 06/22/2019

## 2019-06-22 NOTE — Patient Instructions (Signed)

## 2019-06-29 ENCOUNTER — Encounter: Payer: Self-pay | Admitting: Gynecology

## 2020-01-10 ENCOUNTER — Other Ambulatory Visit: Payer: Self-pay | Admitting: Family Medicine

## 2020-01-10 DIAGNOSIS — Z1231 Encounter for screening mammogram for malignant neoplasm of breast: Secondary | ICD-10-CM

## 2020-02-21 ENCOUNTER — Telehealth (HOSPITAL_COMMUNITY): Payer: Self-pay | Admitting: Family Medicine

## 2020-02-22 ENCOUNTER — Telehealth (HOSPITAL_COMMUNITY): Payer: Self-pay | Admitting: Family Medicine

## 2020-02-23 ENCOUNTER — Telehealth (HOSPITAL_COMMUNITY): Payer: Self-pay

## 2020-02-23 NOTE — Telephone Encounter (Signed)
Pt called and stated that she wanted to schedule for cardiac rehab, advised pt that we havent received a referral. Pt stated that Santiago Glad from [facility] was suppose to fax over a referral on 02/18/2020. Advised pt that we havent received it, pt stated that she will call [facility] and get them to refax it.

## 2020-02-24 NOTE — Telephone Encounter (Signed)
Pt insurance is active and benefits verified through [employer]. Co-pay $0.00, DED $2,500.00/$2,500.00 met, out of pocket $5,000.00/$5,000.00 met, co-insurance 20%. No pre-authorization required. [employer], 02/24/20 @ 10:29AM, REF#[id number]  Will contact patient to see if she is interested in the Cardiac Rehab Program. Patient will be contacted for scheduling upon review by the RN Navigator. 

## 2020-02-24 NOTE — Telephone Encounter (Signed)
Outside/paper referral received by Dr. Donah Driver from [facility]. Will fax over Physician order and request further documents. Insurance benefits and eligibility to be determined.

## 2020-03-06 ENCOUNTER — Telehealth (HOSPITAL_COMMUNITY): Payer: Self-pay | Admitting: *Deleted

## 2020-03-06 ENCOUNTER — Encounter (HOSPITAL_COMMUNITY): Payer: Self-pay | Admitting: *Deleted

## 2020-03-06 NOTE — Telephone Encounter (Signed)
Patient returned my call from message left earlier regarding scheduling cardiac rehab.  Advised pt that she will need to complete follow up with her PCP as recommended by the cardiologist upon discharge.  Pt aware that she has a scheduled appt on 6/14.  Pt reports that she feels good and has no further complaints.  Pt reports compliance with medical therapy.  Pt mentioned that she  is exercising on her treadmill 1.8-2.0 over 0 incline 25-30 minutes 3 times a day without any issues.  Cautioned pt to maintain this level of exercise until evaluated by rehab staff during her orientation appt. Pt verbalized understanding and is looking forward to participating. Cherre Huger, BSN Cardiac and Training and development officer

## 2020-03-06 NOTE — Progress Notes (Signed)
Received referral notification from Dr. Donah Driver at [facility] for this pt to participate in Cardiac Rehab s/p MV Repair on 4/22.  Requested and received MD referral order form and 12 lead ekg.  Reviewed medical history in [place].  Pt has made several phone calls inquiring about scheduling during the interim period we awaited documents.  Pt seen in follow up by CVTS and cardiology as well as colon and rectal surgery for thrombosed hemorrhoid. On pt follow up visit - 5/28 pt had echo completed which showed pericardial effusion.  Pt sent to ER for further follow up.  Pt seen by heart surgeon for possible pericardial window.  Pt opted for NSAIDS and colchicine and was discharged the following day.  Pt to follow up with PCP on 6/14.  Cardiology follow up are scheduled further out.  Called and left message for pt for advisement that she will need to be seen and cleared for participation. Contact information provided. Cherre Huger, BSN Cardiac and Training and development officer

## 2020-03-28 ENCOUNTER — Telehealth (HOSPITAL_COMMUNITY): Payer: Self-pay

## 2020-03-28 NOTE — Telephone Encounter (Signed)
Pt called to inform that she has complete her f/u appt. Adv pt I will have a nurse review her office notes and if she is cleared, we will contact her to schedule.  Explained scheduling process, patient verbalized understanding.

## 2020-04-21 ENCOUNTER — Telehealth (HOSPITAL_COMMUNITY): Payer: Self-pay

## 2020-04-21 NOTE — Telephone Encounter (Signed)
recv'd 2nd referral from [facility]. Called and spoke with Lattie Haw (nurse at [facility]) and adv her of note below. She stated pt cardiologist did approve pt for CR. Lona Kettle would need that in written, she stated she would fax ppw over (gave fax number). Called and adv pt where we are with her referral.

## 2020-05-01 ENCOUNTER — Ambulatory Visit
Admission: RE | Admit: 2020-05-01 | Discharge: 2020-05-01 | Disposition: A | Discharge status: Home / Self Care | Payer: 59 | Source: Ambulatory Visit | Attending: Family Medicine | Admitting: Family Medicine

## 2020-05-01 ENCOUNTER — Other Ambulatory Visit: Payer: Self-pay

## 2020-05-01 DIAGNOSIS — Z1231 Encounter for screening mammogram for malignant neoplasm of breast: Secondary | ICD-10-CM

## 2020-05-16 ENCOUNTER — Encounter (HOSPITAL_COMMUNITY)
Admission: RE | Admit: 2020-05-16 | Discharge: 2020-05-16 | Disposition: A | Discharge status: Home / Self Care | Payer: BC Managed Care – PPO | Source: Ambulatory Visit | Attending: Cardiology | Admitting: Cardiology

## 2020-05-16 ENCOUNTER — Telehealth (HOSPITAL_COMMUNITY): Payer: Self-pay | Admitting: Pharmacist

## 2020-05-16 ENCOUNTER — Other Ambulatory Visit: Payer: Self-pay

## 2020-05-16 DIAGNOSIS — Z9889 Other specified postprocedural states: Secondary | ICD-10-CM | POA: Insufficient documentation

## 2020-05-16 NOTE — Progress Notes (Signed)
Cardiac Rehab Telephone Note:  Successful telephone encounter to Kelsey [name] to confirm Cardiac Rehab orientation appointment for 05/23/20 at 10:00 am. Nursing assessment completed. Patient questions answered. Instructions for appointment provided. Patient screening for Covid-19 negative.  Katryn Plummer E. Rollene Rotunda RN, BSN [place]. [facility]  Cardiac and Pulmonary Rehabilitation Phone: [phone] Fax: [phone]

## 2020-05-17 NOTE — Telephone Encounter (Signed)
Cardiac Rehab Medication Review by a Pharmacist  Does the patient  feel that his/her medications are working for him/her?  yes  Has the patient been experiencing any side effects to the medications prescribed?  no  Does the patient measure his/her own blood pressure or blood glucose at home?  Yes BP. Last BP on 05/13/20 was 114/74. No blood glucose.   Does the patient have any problems obtaining medications due to transportation or finances?   no  Understanding of regimen: good Understanding of indications: good Potential of compliance: good  Pharmacist Intervention: none  Fara Olden, PharmD PGY-1 Ambulatory Care Pharmacy Resident 05/17/2020 6:34 PM

## 2020-05-23 ENCOUNTER — Encounter (HOSPITAL_COMMUNITY)
Admission: RE | Admit: 2020-05-23 | Discharge: 2020-05-23 | Disposition: A | Discharge status: Home / Self Care | Payer: BC Managed Care – PPO | Source: Ambulatory Visit | Attending: Cardiology | Admitting: Cardiology

## 2020-05-23 ENCOUNTER — Other Ambulatory Visit: Payer: Self-pay

## 2020-05-23 ENCOUNTER — Encounter (HOSPITAL_COMMUNITY): Payer: Self-pay

## 2020-05-23 VITALS — BP 120/56 | HR 77 | Resp 18 | Ht 62.5 in | Wt 144.8 lb

## 2020-05-23 DIAGNOSIS — Z9889 Other specified postprocedural states: Secondary | ICD-10-CM

## 2020-05-23 NOTE — Progress Notes (Signed)
Cardiac Individual Treatment Plan  Patient Details  Name: Kelsey [name] MRN: [id number] Date of Birth: 10-20-1960 Referring Provider:     CARDIAC REHAB PHASE II ORIENTATION from 05/23/2020 in [facility]  Referring Provider Sueanne Margarita, MD      Initial Encounter Date:    CARDIAC REHAB PHASE II ORIENTATION from 05/23/2020 in [facility]  Date 05/23/20      Visit Diagnosis: S/P mitral valve repair  Patient's Home Medications on Admission:  Current Outpatient Medications:  .  aspirin 325 MG tablet, Take 325 mg by mouth daily., Disp: , Rfl:  .  Cholecalciferol (VITAMIN D) 2000 UNITS CAPS, Take 2,000 Units by mouth daily. , Disp: , Rfl:  .  CRANBERRY PO, Take 8,400 mg by mouth 2 (two) times daily. , Disp: , Rfl:  .  hydrochlorothiazide (MICROZIDE) 12.5 MG capsule, Take 12.5 mg by mouth daily., Disp: , Rfl:  .  magnesium oxide (MAG-OX) 400 MG tablet, Take 800 mg by mouth daily., Disp: , Rfl:  .  Multiple Vitamins-Iron (MULTIVITAMIN/IRON PO), Take by mouth daily., Disp: , Rfl:  .  rosuvastatin (CRESTOR) 10 MG tablet, Take 10 mg by mouth every other day., Disp: , Rfl:   Past Medical History: Past Medical History:  Diagnosis Date  . DVT (deep venous thrombosis) (HCC)    HISTORY OF  . Fibroid   . Gilbert disease   . 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 History of diverticular abscess   . Osteopenia 07/2018   T score -2.0 FRAX 8.7% / 1.1% stable from prior DEXA    Tobacco Use: Social History   Tobacco Use  Smoking Status Never Smoker  Smokeless Tobacco Never Used    Labs: Recent Review Scientist, physiological    Labs for ITP Cardiac and Pulmonary Rehab Latest Ref Rng & Units 01/17/2012   Cholestrol 0 - 200 mg/dL 288(H)   LDLCALC 0 - 99 mg/dL 207(H)   HDL >39 mg/dL 56   Trlycerides <150 mg/dL 124      Capillary Blood Glucose: No results found for: GLUCAP   Exercise Target Goals: Exercise Program Goal: Individual  exercise prescription set using results from initial 6 min walk test and THRR while considering  patient's activity barriers and safety.   Exercise Prescription Goal: Initial exercise prescription builds to 30-45 minutes a day of aerobic activity, 2-3 days per week.  Home exercise guidelines will be given to patient during program as part of exercise prescription that the participant will acknowledge.  Activity Barriers & Risk Stratification:  Activity Barriers & Cardiac Risk Stratification - 05/23/20 1024      Activity Barriers & Cardiac Risk Stratification   Activity Barriers None    Cardiac Risk Stratification Moderate           6 Minute Walk:  6 Minute Walk    Row Name 05/23/20 1046         6 Minute Walk   Phase Initial     Distance 1490 feet     Walk Time 6 minutes     # of Rest Breaks 0     MPH 2.82     METS 3.74     RPE 9     Perceived Dyspnea  0     VO2 Peak 13.08     Symptoms No     Resting HR 76 bpm     Resting BP 120/56     Resting Oxygen Saturation  97 %     Exercise Oxygen Saturation  during 6 min walk 99 %     Max Ex. HR 102 bpm     Max Ex. BP 118/80     2 Minute Post BP 102/78            Oxygen Initial Assessment:   Oxygen Re-Evaluation:   Oxygen Discharge (Final Oxygen Re-Evaluation):   Initial Exercise Prescription:  Initial Exercise Prescription - 05/23/20 1300      Date of Initial Exercise RX and Referring Provider   Date 05/23/20    Referring Provider Sueanne Margarita, MD    Expected Discharge Date 07/21/20      Treadmill   MPH 3    Grade 0    Minutes 15    METs 3.3      Bike   Level 2    Minutes 15    METs 3.3      Prescription Details   Frequency (times per week) 3    Duration Progress to 30 minutes of continuous aerobic without signs/symptoms of physical distress      Intensity   THRR 40-80% of Max Heartrate 64-129    Ratings of Perceived Exertion 11-13    Perceived Dyspnea 0-4      Progression   Progression  Continue to progress workloads to maintain intensity without signs/symptoms of physical distress.      Resistance Training   Training Prescription Yes    Weight 4lbs    Reps 10-15           Perform Capillary Blood Glucose checks as needed.  Exercise Prescription Changes:   Exercise Comments:   Exercise Goals and Review:   Exercise Goals    Row Name 05/23/20 1024             Exercise Goals   Increase Physical Activity Yes       Intervention Provide advice, education, support and counseling about physical activity/exercise needs.;Develop an individualized exercise prescription for aerobic and resistive training based on initial evaluation findings, risk stratification, comorbidities and participant's personal goals.       Expected Outcomes Short Term: Attend rehab on a regular basis to increase amount of physical activity.;Long Term: Exercising regularly at least 3-5 days a week.;Long Term: Add in home exercise to make exercise part of routine and to increase amount of physical activity.       Increase Strength and Stamina Yes       Intervention Provide advice, education, support and counseling about physical activity/exercise needs.;Develop an individualized exercise prescription for aerobic and resistive training based on initial evaluation findings, risk stratification, comorbidities and participant's personal goals.       Expected Outcomes Short Term: Increase workloads from initial exercise prescription for resistance, speed, and METs.;Short Term: Perform resistance training exercises routinely during rehab and add in resistance training at home;Long Term: Improve cardiorespiratory fitness, muscular endurance and strength as measured by increased METs and functional capacity (6MWT)       Able to understand and use rate of perceived exertion (RPE) scale Yes       Intervention Provide education and explanation on how to use RPE scale       Expected Outcomes Short Term: Able to  use RPE daily in rehab to express subjective intensity level;Long Term:  Able to use RPE to guide intensity level when exercising independently       Knowledge and understanding of Target Heart Rate Range (THRR) Yes  Intervention Provide education and explanation of THRR including how the numbers were predicted and where they are located for reference       Expected Outcomes Short Term: Able to state/look up THRR;Long Term: Able to use THRR to govern intensity when exercising independently;Short Term: Able to use daily as guideline for intensity in rehab       Able to check pulse independently Yes       Intervention Provide education and demonstration on how to check pulse in carotid and radial arteries.;Review the importance of being able to check your own pulse for safety during independent exercise       Expected Outcomes Short Term: Able to explain why pulse checking is important during independent exercise;Long Term: Able to check pulse independently and accurately       Understanding of Exercise Prescription Yes       Intervention Provide education, explanation, and written materials on patient's individual exercise prescription       Expected Outcomes Short Term: Able to explain program exercise prescription;Long Term: Able to explain home exercise prescription to exercise independently              Exercise Goals Re-Evaluation :   Discharge Exercise Prescription (Final Exercise Prescription Changes):   Nutrition:  Target Goals: Understanding of nutrition guidelines, daily intake of sodium 1500mg , cholesterol 200mg , calories 30% from fat and 7% or less from saturated fats, daily to have 5 or more servings of fruits and vegetables.  Biometrics:  Pre Biometrics - 05/23/20 1026      Pre Biometrics   Waist Circumference 33 inches    Hip Circumference 39 inches    Waist to Hip Ratio 0.85 %    Triceps Skinfold 23.5 mm    % Body Fat 36.1 %    Grip Strength 28.5 kg     Flexibility 17.5 in    Single Leg Stand 20 seconds            Nutrition Therapy Plan and Nutrition Goals:   Nutrition Assessments:   Nutrition Goals Re-Evaluation:   Nutrition Goals Re-Evaluation:   Nutrition Goals Discharge (Final Nutrition Goals Re-Evaluation):   Psychosocial: Target Goals: Acknowledge presence or absence of significant depression and/or stress, maximize coping skills, provide positive support system. Participant is able to verbalize types and ability to use techniques and skills needed for reducing stress and depression.  Initial Review & Psychosocial Screening:  Initial Psych Review & Screening - 05/23/20 1506      Initial Review   Current issues with None Identified      Family Dynamics   Good Support System? Yes    Comments Ms. Dubberly presents to cardiac rehab orientation with a positive attitude and outlook. She denies the need for psychosocial interventions. She admits to having a strong support system including her husband and her children.      Barriers   Psychosocial barriers to participate in program There are no identifiable barriers or psychosocial needs.      Screening Interventions   Interventions Encouraged to exercise           Quality of Life Scores:  Quality of Life - 05/23/20 1344      Quality of Life   Select Quality of Life      Quality of Life Scores   Health/Function Pre 27.2 %    Socioeconomic Pre 28.29 %    Psych/Spiritual Pre 26.57 %    Family Pre 28.8 %    GLOBAL Pre 27.53 %  Scores of 19 and below usually indicate a poorer quality of life in these areas.  A difference of  2-3 points is a clinically meaningful difference.  A difference of 2-3 points in the total score of the Quality of Life Index has been associated with significant improvement in overall quality of life, self-image, physical symptoms, and general health in studies assessing change in quality of life.  PHQ-9: Recent Review Flowsheet  Data    Depression screen [facility] 2/9 05/23/2020   Decreased Interest 0   Down, Depressed, Hopeless 0   PHQ - 2 Score 0     Interpretation of Total Score  Total Score Depression Severity:  1-4 = Minimal depression, 5-9 = Mild depression, 10-14 = Moderate depression, 15-19 = Moderately severe depression, 20-27 = Severe depression   Psychosocial Evaluation and Intervention:   Psychosocial Re-Evaluation:   Psychosocial Discharge (Final Psychosocial Re-Evaluation):   Vocational Rehabilitation: Provide vocational rehab assistance to qualifying candidates.   Vocational Rehab Evaluation & Intervention:  Vocational Rehab - 05/23/20 1509      Initial Vocational Rehab Evaluation & Intervention   Assessment shows need for Vocational Rehabilitation No           Education: Education Goals: Education classes will be provided on a weekly basis, covering required topics. Participant will state understanding/return demonstration of topics presented.  Learning Barriers/Preferences:   Education Topics: Count Your Pulse:  -Group instruction provided by verbal instruction, demonstration, patient participation and written materials to support subject.  Instructors address importance of being able to find your pulse and how to count your pulse when at home without a heart monitor.  Patients get hands on experience counting their pulse with staff help and individually.   Heart Attack, Angina, and Risk Factor Modification:  -Group instruction provided by verbal instruction, video, and written materials to support subject.  Instructors address signs and symptoms of angina and heart attacks.    Also discuss risk factors for heart disease and how to make changes to improve heart health risk factors.   Functional Fitness:  -Group instruction provided by verbal instruction, demonstration, patient participation, and written materials to support subject.  Instructors address safety measures for doing  things around the house.  Discuss how to get up and down off the floor, how to pick things up properly, how to safely get out of a chair without assistance, and balance training.   Meditation and Mindfulness:  -Group instruction provided by verbal instruction, patient participation, and written materials to support subject.  Instructor addresses importance of mindfulness and meditation practice to help reduce stress and improve awareness.  Instructor also leads participants through a meditation exercise.    Stretching for Flexibility and Mobility:  -Group instruction provided by verbal instruction, patient participation, and written materials to support subject.  Instructors lead participants through series of stretches that are designed to increase flexibility thus improving mobility.  These stretches are additional exercise for major muscle groups that are typically performed during regular warm up and cool down.   Hands Only CPR:  -Group verbal, video, and participation provides a basic overview of AHA guidelines for community CPR. Role-play of emergencies allow participants the opportunity to practice calling for help and chest compression technique with discussion of AED use.   Hypertension: -Group verbal and written instruction that provides a basic overview of hypertension including the most recent diagnostic guidelines, risk factor reduction with self-care instructions and medication management.    Nutrition I class: Heart Healthy Eating:  -Group  instruction provided by PowerPoint slides, verbal discussion, and written materials to support subject matter. The instructor gives an explanation and review of the Therapeutic Lifestyle Changes diet recommendations, which includes a discussion on lipid goals, dietary fat, sodium, fiber, plant stanol/sterol esters, sugar, and the components of a well-balanced, healthy diet.   Nutrition II class: Lifestyle Skills:  -Group instruction provided  by PowerPoint slides, verbal discussion, and written materials to support subject matter. The instructor gives an explanation and review of label reading, grocery shopping for heart health, heart healthy recipe modifications, and ways to make healthier choices when eating out.   Diabetes Question & Answer:  -Group instruction provided by PowerPoint slides, verbal discussion, and written materials to support subject matter. The instructor gives an explanation and review of diabetes co-morbidities, pre- and post-prandial blood glucose goals, pre-exercise blood glucose goals, signs, symptoms, and treatment of hypoglycemia and hyperglycemia, and foot care basics.   Diabetes Blitz:  -Group instruction provided by PowerPoint slides, verbal discussion, and written materials to support subject matter. The instructor gives an explanation and review of the physiology behind type 1 and type 2 diabetes, diabetes medications and rational behind using different medications, pre- and post-prandial blood glucose recommendations and Hemoglobin A1c goals, diabetes diet, and exercise including blood glucose guidelines for exercising safely.    Portion Distortion:  -Group instruction provided by PowerPoint slides, verbal discussion, written materials, and food models to support subject matter. The instructor gives an explanation of serving size versus portion size, changes in portions sizes over the last 20 years, and what consists of a serving from each food group.   Stress Management:  -Group instruction provided by verbal instruction, video, and written materials to support subject matter.  Instructors review role of stress in heart disease and how to cope with stress positively.     Exercising on Your Own:  -Group instruction provided by verbal instruction, power point, and written materials to support subject.  Instructors discuss benefits of exercise, components of exercise, frequency and intensity of  exercise, and end points for exercise.  Also discuss use of nitroglycerin and activating EMS.  Review options of places to exercise outside of rehab.  Review guidelines for sex with heart disease.   Cardiac Drugs I:  -Group instruction provided by verbal instruction and written materials to support subject.  Instructor reviews cardiac drug classes: antiplatelets, anticoagulants, beta blockers, and statins.  Instructor discusses reasons, side effects, and lifestyle considerations for each drug class.   Cardiac Drugs II:  -Group instruction provided by verbal instruction and written materials to support subject.  Instructor reviews cardiac drug classes: angiotensin converting enzyme inhibitors (ACE-I), angiotensin II receptor blockers (ARBs), nitrates, and calcium channel blockers.  Instructor discusses reasons, side effects, and lifestyle considerations for each drug class.   Anatomy and Physiology of the Circulatory System:  Group verbal and written instruction and models provide basic cardiac anatomy and physiology, with the coronary electrical and arterial systems. Review of: AMI, Angina, Valve disease, Heart Failure, Peripheral Artery Disease, Cardiac Arrhythmia, Pacemakers, and the ICD.   Other Education:  -Group or individual verbal, written, or video instructions that support the educational goals of the cardiac rehab program.   Holiday Eating Survival Tips:  -Group instruction provided by PowerPoint slides, verbal discussion, and written materials to support subject matter. The instructor gives patients tips, tricks, and techniques to help them not only survive but enjoy the holidays despite the onslaught of food that accompanies the holidays.   Knowledge Questionnaire Score:  Knowledge Questionnaire Score - 05/23/20 1345      Knowledge Questionnaire Score   Pre Score 18/24           Core Components/Risk Factors/Patient Goals at Admission:  Personal Goals and Risk Factors at  Admission - 05/23/20 1024      Core Components/Risk Factors/Patient Goals on Admission   Hypertension Yes    Intervention Provide education on lifestyle modifcations including regular physical activity/exercise, weight management, moderate sodium restriction and increased consumption of fresh fruit, vegetables, and low fat dairy, alcohol moderation, and smoking cessation.;Monitor prescription use compliance.    Expected Outcomes Short Term: Continued assessment and intervention until BP is < 140/2mm HG in hypertensive participants. < 130/74mm HG in hypertensive participants with diabetes, heart failure or chronic kidney disease.;Long Term: Maintenance of blood pressure at goal levels.    Lipids Yes    Intervention Provide education and support for participant on nutrition & aerobic/resistive exercise along with prescribed medications to achieve LDL 70mg , HDL >40mg .    Expected Outcomes Short Term: Participant states understanding of desired cholesterol values and is compliant with medications prescribed. Participant is following exercise prescription and nutrition guidelines.;Long Term: Cholesterol controlled with medications as prescribed, with individualized exercise RX and with personalized nutrition plan. Value goals: LDL < 70mg , HDL > 40 mg.    Stress Yes    Intervention Offer individual and/or small group education and counseling on adjustment to heart disease, stress management and health-related lifestyle change. Teach and support self-help strategies.;Refer participants experiencing significant psychosocial distress to appropriate mental health specialists for further evaluation and treatment. When possible, include family members and significant others in education/counseling sessions.    Expected Outcomes Short Term: Participant demonstrates changes in health-related behavior, relaxation and other stress management skills, ability to obtain effective social support, and compliance with  psychotropic medications if prescribed.;Long Term: Emotional wellbeing is indicated by absence of clinically significant psychosocial distress or social isolation.           Core Components/Risk Factors/Patient Goals Review:    Core Components/Risk Factors/Patient Goals at Discharge (Final Review):    ITP Comments:  ITP Comments    Row Name 05/23/20 1026           ITP Comments Dr. Fransico Him, Medical Director Cardiac Rehab [facility]              Comments: Patient attended orientation on 05/23/2020 to review rules and guidelines for program.  Completed 6 minute walk test, Intitial ITP, and exercise prescription.  VSS. Telemetry-NSR.  Asymptomatic. Safety measures and social distancing in place per CDC guidelines.

## 2020-05-29 ENCOUNTER — Encounter (HOSPITAL_COMMUNITY): Payer: BC Managed Care – PPO

## 2020-05-29 ENCOUNTER — Telehealth (HOSPITAL_COMMUNITY): Payer: Self-pay

## 2020-05-29 NOTE — Telephone Encounter (Signed)
Pt called and stated that she will not be at her cardiac rehab session on 05/29/2020. I advised the nurses and EP of pt absence on today.

## 2020-05-31 ENCOUNTER — Encounter (HOSPITAL_COMMUNITY)
Admission: RE | Admit: 2020-05-31 | Discharge: 2020-05-31 | Disposition: A | Discharge status: Home / Self Care | Payer: BC Managed Care – PPO | Source: Ambulatory Visit | Attending: Cardiology | Admitting: Cardiology

## 2020-05-31 ENCOUNTER — Other Ambulatory Visit: Payer: Self-pay

## 2020-05-31 DIAGNOSIS — Z9889 Other specified postprocedural states: Secondary | ICD-10-CM | POA: Insufficient documentation

## 2020-05-31 NOTE — Progress Notes (Signed)
Daily Session Note  Patient Details  Name: Kelsey [name] MRN: [id number] Date of Birth: 1960/10/21 Referring Provider:     [facility] from 05/23/2020 in [place]  Referring Provider Kelsey Margarita, MD      Encounter Date: 05/31/2020  Check In:  Session Check In - 05/31/20 1157      Check-In   Supervising physician immediately available to respond to emergencies [facility] immediately available    Physician(s) Dr. Cathlean [name]    Location [facility]    Staff Present Kelsey Fountain, RN, Kelsey Pretty, MS, ACSM CEP, Exercise Physiologist;Kelsey Ysidro Evert, RN;Kelsey Badger Lee, MS, EP-C, CCRP;Kelsey Nevels, MS,ACSM CEP, Exercise Physiologist;Kelsey Valadez, RN, BSN    Virtual Visit No    Medication changes reported     No    Fall or balance concerns reported    No    Tobacco Cessation No Change    Current number of cigarettes/nicotine per day     0    Warm-up and Cool-down Performed on first and last piece of equipment    Resistance Training Performed No    VAD Patient? No    PAD/SET Patient? No      Pain Assessment   Currently in Pain? No/denies    Multiple Pain Sites No           Capillary Blood Glucose: No results found for this or any previous visit (from the past 24 hour(s)).   Exercise Prescription Changes - 05/31/20 1300      Response to Exercise   Blood Pressure (Admit) 110/62    Blood Pressure (Exercise) 128/70    Blood Pressure (Exit) 110/70    Heart Rate (Admit) 87 bpm    Heart Rate (Exercise) 121 bpm    Heart Rate (Exit) 75 bpm    Rating of Perceived Exertion (Exercise) 11    Perceived Dyspnea (Exercise) 0    Symptoms None    Comments Pt's first day of exercise    Duration Progress to 30 minutes of  aerobic without signs/symptoms of physical distress    Intensity THRR unchanged      Progression   Progression Continue to progress workloads to maintain intensity without signs/symptoms  of physical distress.    Average METs 3.1      Resistance Training   Training Prescription No      Treadmill   MPH 2.5    Grade 0    Minutes 15    METs 2.91      Bike   Level 2    Minutes 15    METs 3.3           Social History   Tobacco Use  Smoking Status Never Smoker  Smokeless Tobacco Never Used    Goals Met:  Exercise tolerated well No report of cardiac concerns or symptoms  Goals Unmet:  Not Applicable  Comments: Kelsey [name]  started cardiac rehab today.  Pt tolerated light exercise without difficulty. VSS, telemetry-Sinus Rhythm , asymptomatic.  Medication list reconciled. Pt denies barriers to medicaiton compliance.  PSYCHOSOCIAL ASSESSMENT:  PHQ-0. Pt exhibits positive coping skills, hopeful outlook with supportive family. No psychosocial needs identified at this time, no psychosocial interventions necessary.    Pt enjoys going to the beach.   Pt oriented to exercise equipment and routine.    Understanding verbalized.Kelsey Pall, RN,BSN 05/31/2020 2:49 PM   Dr. Fransico [name] is Medical Director for Cardiac Rehab at [facility].

## 2020-06-02 ENCOUNTER — Encounter (HOSPITAL_COMMUNITY)
Admission: RE | Admit: 2020-06-02 | Discharge: 2020-06-02 | Disposition: A | Discharge status: Home / Self Care | Payer: BC Managed Care – PPO | Source: Ambulatory Visit | Attending: Cardiology | Admitting: Cardiology

## 2020-06-02 ENCOUNTER — Other Ambulatory Visit: Payer: Self-pay

## 2020-06-02 DIAGNOSIS — Z9889 Other specified postprocedural states: Secondary | ICD-10-CM | POA: Diagnosis not present

## 2020-06-07 ENCOUNTER — Other Ambulatory Visit: Payer: Self-pay

## 2020-06-07 ENCOUNTER — Encounter (HOSPITAL_COMMUNITY)
Admission: RE | Admit: 2020-06-07 | Discharge: 2020-06-07 | Disposition: A | Discharge status: Home / Self Care | Payer: BC Managed Care – PPO | Source: Ambulatory Visit | Attending: Cardiology | Admitting: Cardiology

## 2020-06-07 DIAGNOSIS — Z9889 Other specified postprocedural states: Secondary | ICD-10-CM

## 2020-06-07 NOTE — Progress Notes (Signed)
Kelsey [name] 59 y.o. female Nutrition Note  Visit Diagnosis: S/P mitral valve repair  Past Medical History:  Diagnosis Date  . DVT (deep venous thrombosis) (HCC)    HISTORY OF  . Fibroid   . Gilbert disease   . 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 History of diverticular abscess   . Osteopenia 07/2018   T score -2.0 FRAX 8.7% / 1.1% stable from prior DEXA     Medications reviewed.   Current Outpatient Medications:  .  aspirin 325 MG tablet, Take 325 mg by mouth daily., Disp: , Rfl:  .  Cholecalciferol (VITAMIN D) 2000 UNITS CAPS, Take 2,000 Units by mouth daily. , Disp: , Rfl:  .  CRANBERRY PO, Take 8,400 mg by mouth 2 (two) times daily. , Disp: , Rfl:  .  hydrochlorothiazide (MICROZIDE) 12.5 MG capsule, Take 12.5 mg by mouth daily., Disp: , Rfl:  .  magnesium oxide (MAG-OX) 400 MG tablet, Take 800 mg by mouth daily., Disp: , Rfl:  .  Multiple Vitamins-Iron (MULTIVITAMIN/IRON PO), Take by mouth daily., Disp: , Rfl:  .  rosuvastatin (CRESTOR) 10 MG tablet, Take 10 mg by mouth every other day., Disp: , Rfl:    Ht Readings from Last 1 Encounters:  05/23/20 5' 2.5" (1.588 m)     Wt Readings from Last 3 Encounters:  05/23/20 144 lb 13.5 oz (65.7 kg)  06/22/19 151 lb (68.5 kg)  06/16/18 144 lb 12.8 oz (65.7 kg)     There is no height or weight on file to calculate BMI.   Social History   Tobacco Use  Smoking Status Never Smoker  Smokeless Tobacco Never Used     Lab Results  Component Value Date   CHOL 288 (H) 01/17/2012   Lab Results  Component Value Date   HDL 56 01/17/2012   Lab Results  Component Value Date   LDLCALC 207 (H) 01/17/2012   Lab Results  Component Value Date   TRIG 124 01/17/2012     No results found for: HGBA1C   CBG (last 3)  No results for input(s): GLUCAP in the last 72 hours.   Nutrition Note  Spoke with pt. Nutrition Plan and Nutrition Survey goals reviewed with pt. Pt is following a Heart Healthy diet.    Per discussion, pt  does not use canned/convenience foods often. Pt does not add salt to food. Pt does not eat out frequently.   Pt expressed understanding of the information reviewed.    Nutrition Diagnosis Food-and nutrition-related knowledge deficit related to lack of exposure to information as related to diagnosis of: ? CVD ?  Nutrition Intervention ? Pt's individual nutrition plan reviewed with pt. ? Benefits of adopting Heart Healthy diet discussed when Medficts reviewed.   ? Continue client-centered nutrition education by RD, as part of interdisciplinary care.  Goal(s) ? Pt to build a healthy plate including vegetables, fruits, whole grains, and low-fat dairy products in a heart healthy meal plan.  Plan:   Will provide client-centered nutrition education as part of interdisciplinary care  Monitor and evaluate progress toward nutrition goal with team.   Michaele Offer, MS, RDN, LDN

## 2020-06-08 NOTE — Progress Notes (Signed)
Cardiac Individual Treatment Plan  Patient Details  Name: Kelsey [name] MRN: [id number] Date of Birth: 10/15/1960 Referring Provider:     CARDIAC REHAB PHASE II ORIENTATION from 05/23/2020 in [place]  Referring Provider Kelsey Margarita, MD      Initial Encounter Date:    CARDIAC REHAB PHASE II ORIENTATION from 05/23/2020 in [place]  Date 05/23/20      Visit Diagnosis: S/P mitral valve repair  Patient's Home Medications on Admission:  Current Outpatient Medications:  .  aspirin 325 MG tablet, Take 325 mg by mouth daily., Disp: , Rfl:  .  Cholecalciferol (VITAMIN D) 2000 UNITS CAPS, Take 2,000 Units by mouth daily. , Disp: , Rfl:  .  CRANBERRY PO, Take 8,400 mg by mouth 2 (two) times daily. , Disp: , Rfl:  .  hydrochlorothiazide (MICROZIDE) 12.5 MG capsule, Take 12.5 mg by mouth daily., Disp: , Rfl:  .  magnesium oxide (MAG-OX) 400 MG tablet, Take 800 mg by mouth daily., Disp: , Rfl:  .  Multiple Vitamins-Iron (MULTIVITAMIN/IRON PO), Take by mouth daily., Disp: , Rfl:  .  rosuvastatin (CRESTOR) 10 MG tablet, Take 10 mg by mouth every other day., Disp: , Rfl:   Past Medical History: Past Medical History:  Diagnosis Date  . DVT (deep venous thrombosis) (HCC)    HISTORY OF  . Fibroid   . Gilbert disease   . 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 History of diverticular abscess   . Osteopenia 07/2018   T score -2.0 FRAX 8.7% / 1.1% stable from prior DEXA    Tobacco Use: Social History   Tobacco Use  Smoking Status Never Smoker  Smokeless Tobacco Never Used    Labs: Recent Review Scientist, physiological    Labs for ITP Cardiac and Pulmonary Rehab Latest Ref Rng & Units 01/17/2012   Cholestrol 0 - 200 mg/dL 288(H)   LDLCALC 0 - 99 mg/dL 207(H)   HDL >39 mg/dL 56   Trlycerides <150 mg/dL 124      Capillary Blood Glucose: No results found for: GLUCAP   Exercise Target Goals: Exercise Program Goal: Individual  exercise prescription set using results from initial 6 min walk test and THRR while considering  patient's activity barriers and safety.   Exercise Prescription Goal: Starting with aerobic activity 30 plus minutes a day, 3 days per week for initial exercise prescription. Provide home exercise prescription and guidelines that participant acknowledges understanding prior to discharge.  Activity Barriers & Risk Stratification:  Activity Barriers & Cardiac Risk Stratification - 05/23/20 1024      Activity Barriers & Cardiac Risk Stratification   Activity Barriers None    Cardiac Risk Stratification Moderate           6 Minute Walk:  6 Minute Walk    Row Name 05/23/20 1046         6 Minute Walk   Phase Initial     Distance 1490 feet     Walk Time 6 minutes     # of Rest Breaks 0     MPH 2.82     METS 3.74     RPE 9     Perceived Dyspnea  0     VO2 Peak 13.08     Symptoms No     Resting HR 76 bpm     Resting BP 120/56     Resting Oxygen Saturation  97 %  Exercise Oxygen Saturation  during 6 min walk 99 %     Max Ex. HR 102 bpm     Max Ex. BP 118/80     2 Minute Post BP 102/78            Oxygen Initial Assessment:   Oxygen Re-Evaluation:   Oxygen Discharge (Final Oxygen Re-Evaluation):   Initial Exercise Prescription:  Initial Exercise Prescription - 05/23/20 1300      Date of Initial Exercise RX and Referring Provider   Date 05/23/20    Referring Provider Kelsey Margarita, MD    Expected Discharge Date 07/21/20      Treadmill   MPH 3    Grade 0    Minutes 15    METs 3.3      Bike   Level 2    Minutes 15    METs 3.3      Prescription Details   Frequency (times per week) 3    Duration Progress to 30 minutes of continuous aerobic without signs/symptoms of physical distress      Intensity   THRR 40-80% of Max Heartrate 64-129    Ratings of Perceived Exertion 11-13    Perceived Dyspnea 0-4      Progression   Progression Continue to progress  workloads to maintain intensity without signs/symptoms of physical distress.      Resistance Training   Training Prescription Yes    Weight 4lbs    Reps 10-15           Perform Capillary Blood Glucose checks as needed.  Exercise Prescription Changes:  Exercise Prescription Changes    Row Name 05/31/20 1300 06/07/20 1436           Response to Exercise   Blood Pressure (Admit) 110/62 128/70      Blood Pressure (Exercise) 128/70 162/78      Blood Pressure (Exit) 110/70 108/78      Heart Rate (Admit) 87 bpm 78 bpm      Heart Rate (Exercise) 121 bpm 125 bpm      Heart Rate (Exit) 75 bpm 82 bpm      Rating of Perceived Exertion (Exercise) 11 11      Perceived Dyspnea (Exercise) 0 0      Symptoms None None      Comments Pt's first day of exercise None      Duration Progress to 30 minutes of  aerobic without signs/symptoms of physical distress Progress to 30 minutes of  aerobic without signs/symptoms of physical distress      Intensity THRR unchanged THRR unchanged        Progression   Progression Continue to progress workloads to maintain intensity without signs/symptoms of physical distress. Continue to progress workloads to maintain intensity without signs/symptoms of physical distress.      Average METs 3.1 3.5        Resistance Training   Training Prescription No No        Treadmill   MPH 2.5 2.5      Grade 0 0      Minutes 15 15      METs 2.91 2.91        Bike   Level 2 2      Minutes 15 15      METs 3.3 4             Exercise Comments:  Exercise Comments    Row Name 05/31/20 1310  Exercise Comments Pt's first day of cardiac rehab. Pt responded well to exercise prescription. Will continue to monitor and progress pt as tolerated.              Exercise Goals and Review:  Exercise Goals    Row Name 05/23/20 1024             Exercise Goals   Increase Physical Activity Yes       Intervention Provide advice, education, support and  counseling about physical activity/exercise needs.;Develop an individualized exercise prescription for aerobic and resistive training based on initial evaluation findings, risk stratification, comorbidities and participant's personal goals.       Expected Outcomes Short Term: Attend rehab on a regular basis to increase amount of physical activity.;Long Term: Exercising regularly at least 3-5 days a week.;Long Term: Add in home exercise to make exercise part of routine and to increase amount of physical activity.       Increase Strength and Stamina Yes       Intervention Provide advice, education, support and counseling about physical activity/exercise needs.;Develop an individualized exercise prescription for aerobic and resistive training based on initial evaluation findings, risk stratification, comorbidities and participant's personal goals.       Expected Outcomes Short Term: Increase workloads from initial exercise prescription for resistance, speed, and METs.;Short Term: Perform resistance training exercises routinely during rehab and add in resistance training at home;Long Term: Improve cardiorespiratory fitness, muscular endurance and strength as measured by increased METs and functional capacity (6MWT)       Able to understand and use rate of perceived exertion (RPE) scale Yes       Intervention Provide education and explanation on how to use RPE scale       Expected Outcomes Short Term: Able to use RPE daily in rehab to express subjective intensity level;Long Term:  Able to use RPE to guide intensity level when exercising independently       Knowledge and understanding of Target Heart Rate Range (THRR) Yes       Intervention Provide education and explanation of THRR including how the numbers were predicted and where they are located for reference       Expected Outcomes Short Term: Able to state/look up THRR;Long Term: Able to use THRR to govern intensity when exercising independently;Short Term:  Able to use daily as guideline for intensity in rehab       Able to check pulse independently Yes       Intervention Provide education and demonstration on how to check pulse in carotid and radial arteries.;Review the importance of being able to check your own pulse for safety during independent exercise       Expected Outcomes Short Term: Able to explain why pulse checking is important during independent exercise;Long Term: Able to check pulse independently and accurately       Understanding of Exercise Prescription Yes       Intervention Provide education, explanation, and written materials on patient's individual exercise prescription       Expected Outcomes Short Term: Able to explain program exercise prescription;Long Term: Able to explain home exercise prescription to exercise independently              Exercise Goals Re-Evaluation :  Exercise Goals Re-Evaluation    Row Name 05/31/20 1310             Exercise Goal Re-Evaluation   Exercise Goals Review Increase Physical Activity;Able to understand and use rate of perceived  exertion (RPE) scale;Knowledge and understanding of Target Heart Rate Range (THRR)       Comments Pt's first day of exercise. Pt oriented to equipment.  Pt able to exercise 30 minutes with minimal difficulty. Will continue to monitor.       Expected Outcomes Pt will continue to increase cardiovascular strength.               Discharge Exercise Prescription (Final Exercise Prescription Changes):  Exercise Prescription Changes - 06/07/20 1436      Response to Exercise   Blood Pressure (Admit) 128/70    Blood Pressure (Exercise) 162/78    Blood Pressure (Exit) 108/78    Heart Rate (Admit) 78 bpm    Heart Rate (Exercise) 125 bpm    Heart Rate (Exit) 82 bpm    Rating of Perceived Exertion (Exercise) 11    Perceived Dyspnea (Exercise) 0    Symptoms None    Comments None    Duration Progress to 30 minutes of  aerobic without signs/symptoms of physical  distress    Intensity THRR unchanged      Progression   Progression Continue to progress workloads to maintain intensity without signs/symptoms of physical distress.    Average METs 3.5      Resistance Training   Training Prescription No      Treadmill   MPH 2.5    Grade 0    Minutes 15    METs 2.91      Bike   Level 2    Minutes 15    METs 4           Nutrition:  Target Goals: Understanding of nutrition guidelines, daily intake of sodium 1500mg , cholesterol 200mg , calories 30% from fat and 7% or less from saturated fats, daily to have 5 or more servings of fruits and vegetables.  Biometrics:  Pre Biometrics - 05/23/20 1026      Pre Biometrics   Waist Circumference 33 inches    Hip Circumference 39 inches    Waist to Hip Ratio 0.85 %    Triceps Skinfold 23.5 mm    % Body Fat 36.1 %    Grip Strength 28.5 kg    Flexibility 17.5 in    Single Leg Stand 20 seconds            Nutrition Therapy Plan and Nutrition Goals:  Nutrition Therapy & Goals - 06/07/20 1156      Nutrition Therapy   Diet Heart Healthy      Personal Nutrition Goals   Nutrition Goal Pt to build a healthy plate including vegetables, fruits, whole grains, and low-fat dairy products in a heart healthy meal plan.      Intervention Plan   Intervention Prescribe, educate and counsel regarding individualized specific dietary modifications aiming towards targeted core components such as weight, hypertension, lipid management, diabetes, heart failure and other comorbidities.    Expected Outcomes Short Term Goal: Understand basic principles of dietary content, such as calories, fat, sodium, cholesterol and nutrients.           Nutrition Assessments:  Nutrition Assessments - 06/01/20 1101      MEDFICTS Scores   Pre Score 18           Nutrition Goals Re-Evaluation:  Nutrition Goals Re-Evaluation    Row Name 06/07/20 1157             Goals   Current Weight 143 lb (64.9 kg)        Nutrition  Goal Pt to build a healthy plate including vegetables, fruits, whole grains, and low-fat dairy products in a heart healthy meal plan.              Nutrition Goals Discharge (Final Nutrition Goals Re-Evaluation):  Nutrition Goals Re-Evaluation - 06/07/20 1157      Goals   Current Weight 143 lb (64.9 kg)    Nutrition Goal Pt to build a healthy plate including vegetables, fruits, whole grains, and low-fat dairy products in a heart healthy meal plan.           Psychosocial: Target Goals: Acknowledge presence or absence of significant depression and/or stress, maximize coping skills, provide positive support system. Participant is able to verbalize types and ability to use techniques and skills needed for reducing stress and depression.  Initial Review & Psychosocial Screening:  Initial Psych Review & Screening - 05/23/20 1506      Initial Review   Current issues with None Identified      Family Dynamics   Good Support System? Yes    Comments Kelsey [name] presents to cardiac rehab orientation with a positive attitude and outlook. She denies the need for psychosocial interventions. She admits to having a strong support system including her husband and her children.      Barriers   Psychosocial barriers to participate in program There are no identifiable barriers or psychosocial needs.      Screening Interventions   Interventions Encouraged to exercise           Quality of Life Scores:  Quality of Life - 05/23/20 1344      Quality of Life   Select Quality of Life      Quality of Life Scores   Health/Function Pre 27.2 %    Socioeconomic Pre 28.29 %    Psych/Spiritual Pre 26.57 %    Family Pre 28.8 %    GLOBAL Pre 27.53 %          Scores of 19 and below usually indicate a poorer quality of life in these areas.  A difference of  2-3 points is a clinically meaningful difference.  A difference of 2-3 points in the total score of the Quality of Life Index has been  associated with significant improvement in overall quality of life, self-image, physical symptoms, and general health in studies assessing change in quality of life.  PHQ-9: Recent Review Flowsheet Data    Depression screen [facility] 2/9 05/23/2020   Decreased Interest 0   Down, Depressed, Hopeless 0   PHQ - 2 Score 0     Interpretation of Total Score  Total Score Depression Severity:  1-4 = Minimal depression, 5-9 = Mild depression, 10-14 = Moderate depression, 15-19 = Moderately severe depression, 20-27 = Severe depression   Psychosocial Evaluation and Intervention:   Psychosocial Re-Evaluation:  Psychosocial Re-Evaluation    [place] Name 05/31/20 1442 06/08/20 1701           Psychosocial Re-Evaluation   Current issues with None Identified None Identified      Interventions Encouraged to attend Cardiac Rehabilitation for the exercise Encouraged to attend Cardiac Rehabilitation for the exercise      Continue Psychosocial Services  No Follow up required No Follow up required             Psychosocial Discharge (Final Psychosocial Re-Evaluation):  Psychosocial Re-Evaluation - 06/08/20 1701      Psychosocial Re-Evaluation   Current issues with None Identified    Interventions Encouraged to  attend Cardiac Rehabilitation for the exercise    Continue Psychosocial Services  No Follow up required           Vocational Rehabilitation: Provide vocational rehab assistance to qualifying candidates.   Vocational Rehab Evaluation & Intervention:  Vocational Rehab - 05/23/20 1509      Initial Vocational Rehab Evaluation & Intervention   Assessment shows need for Vocational Rehabilitation No           Education: Education Goals: Education classes will be provided on a weekly basis, covering required topics. Participant will state understanding/return demonstration of topics presented.  Learning Barriers/Preferences:   Education Topics: Hypertension, Hypertension  Reduction -Define heart disease and high blood pressure. Discus how high blood pressure affects the body and ways to reduce high blood pressure.   Exercise and Your Heart -Discuss why it is important to exercise, the FITT principles of exercise, normal and abnormal responses to exercise, and how to exercise safely.   Angina -Discuss definition of angina, causes of angina, treatment of angina, and how to decrease risk of having angina.   Cardiac Medications -Review what the following cardiac medications are used for, how they affect the body, and side effects that may occur when taking the medications.  Medications include Aspirin, Beta blockers, calcium channel blockers, ACE Inhibitors, angiotensin receptor blockers, diuretics, digoxin, and antihyperlipidemics.   Congestive Heart Failure -Discuss the definition of CHF, how to live with CHF, the signs and symptoms of CHF, and how keep track of weight and sodium intake.   Heart Disease and Intimacy -Discus the effect sexual activity has on the heart, how changes occur during intimacy as we age, and safety during sexual activity.   Smoking Cessation / COPD -Discuss different methods to quit smoking, the health benefits of quitting smoking, and the definition of COPD.   Nutrition I: Fats -Discuss the types of cholesterol, what cholesterol does to the heart, and how cholesterol levels can be controlled.   Nutrition II: Labels -Discuss the different components of food labels and how to read food label   Heart Parts/Heart Disease and PAD -Discuss the anatomy of the heart, the pathway of blood circulation through the heart, and these are affected by heart disease.   Stress I: Signs and Symptoms -Discuss the causes of stress, how stress may lead to anxiety and depression, and ways to limit stress.   Stress II: Relaxation -Discuss different types of relaxation techniques to limit stress.   Warning Signs of Stroke / TIA -Discuss  definition of a stroke, what the signs and symptoms are of a stroke, and how to identify when someone is having stroke.   Knowledge Questionnaire Score:  Knowledge Questionnaire Score - 05/23/20 1345      Knowledge Questionnaire Score   Pre Score 18/24           Core Components/Risk Factors/Patient Goals at Admission:  Personal Goals and Risk Factors at Admission - 05/23/20 1024      Core Components/Risk Factors/Patient Goals on Admission   Hypertension Yes    Intervention Provide education on lifestyle modifcations including regular physical activity/exercise, weight management, moderate sodium restriction and increased consumption of fresh fruit, vegetables, and low fat dairy, alcohol moderation, and smoking cessation.;Monitor prescription use compliance.    Expected Outcomes Short Term: Continued assessment and intervention until BP is < 140/73mm HG in hypertensive participants. < 130/6mm HG in hypertensive participants with diabetes, heart failure or chronic kidney disease.;Long Term: Maintenance of blood pressure at goal levels.  Lipids Yes    Intervention Provide education and support for participant on nutrition & aerobic/resistive exercise along with prescribed medications to achieve LDL 70mg , HDL >40mg .    Expected Outcomes Short Term: Participant states understanding of desired cholesterol values and is compliant with medications prescribed. Participant is following exercise prescription and nutrition guidelines.;Long Term: Cholesterol controlled with medications as prescribed, with individualized exercise RX and with personalized nutrition plan. Value goals: LDL < 70mg , HDL > 40 mg.    Stress Yes    Intervention Offer individual and/or small group education and counseling on adjustment to heart disease, stress management and health-related lifestyle change. Teach and support self-help strategies.;Refer participants experiencing significant psychosocial distress to appropriate  mental health specialists for further evaluation and treatment. When possible, include family members and significant others in education/counseling sessions.    Expected Outcomes Short Term: Participant demonstrates changes in health-related behavior, relaxation and other stress management skills, ability to obtain effective social support, and compliance with psychotropic medications if prescribed.;Long Term: Emotional wellbeing is indicated by absence of clinically significant psychosocial distress or social isolation.           Core Components/Risk Factors/Patient Goals Review:   Goals and Risk Factor Review    Row Name 05/31/20 1442 06/08/20 1701           Core Components/Risk Factors/Patient Goals Review   Personal Goals Review Hypertension;Lipids;Stress Hypertension;Lipids;Stress      Review Kelsey [name] started exercise on 05/31/20 and exercised without difficulty. Kelsey [name] has been doing well with exercise. Kelsey [name]'s vital signs have been stable      Expected Outcomes Patient will continue to participate in phase 2 for exercise, nutrtion and lifestyle modifications Patient will continue to participate in phase 2 for exercise, nutrtion and lifestyle modifications             Core Components/Risk Factors/Patient Goals at Discharge (Final Review):   Goals and Risk Factor Review - 06/08/20 1701      Core Components/Risk Factors/Patient Goals Review   Personal Goals Review Hypertension;Lipids;Stress    Review Kelsey [name] has been doing well with exercise. Kelsey [name]'s vital signs have been stable    Expected Outcomes Patient will continue to participate in phase 2 for exercise, nutrtion and lifestyle modifications           ITP Comments:  ITP Comments    Row Name 05/23/20 1026 05/31/20 1450 06/08/20 1659       ITP Comments Dr. Fransico Him, Medical Director Cardiac Rehab [place] 30 Day ITP Review. Xiana started exercise on 05/31/20 and did well with exercise. 30 Day ITP Review.  Patient has good attendance and participation in phase 2 cardiac rehab so far            Comments: See ITP comments. Kelsey [name] has not been vaccinated for COVID 19 provided information to patient on scheduling vaccine through [email or web address], RN,BSN 06/08/2020 5:04 PM

## 2020-06-09 ENCOUNTER — Encounter (HOSPITAL_COMMUNITY): Payer: BC Managed Care – PPO

## 2020-06-12 ENCOUNTER — Encounter (HOSPITAL_COMMUNITY): Payer: BC Managed Care – PPO

## 2020-06-14 ENCOUNTER — Encounter (HOSPITAL_COMMUNITY)
Admission: RE | Admit: 2020-06-14 | Discharge: 2020-06-14 | Disposition: A | Discharge status: Home / Self Care | Payer: BC Managed Care – PPO | Source: Ambulatory Visit | Attending: Cardiology | Admitting: Cardiology

## 2020-06-14 ENCOUNTER — Other Ambulatory Visit: Payer: Self-pay

## 2020-06-14 DIAGNOSIS — Z9889 Other specified postprocedural states: Secondary | ICD-10-CM

## 2020-06-16 ENCOUNTER — Encounter (HOSPITAL_COMMUNITY): Payer: BC Managed Care – PPO

## 2020-06-19 ENCOUNTER — Encounter (HOSPITAL_COMMUNITY)
Admission: RE | Admit: 2020-06-19 | Discharge: 2020-06-19 | Disposition: A | Discharge status: Home / Self Care | Payer: BC Managed Care – PPO | Source: Ambulatory Visit | Attending: Cardiology | Admitting: Cardiology

## 2020-06-19 ENCOUNTER — Other Ambulatory Visit: Payer: Self-pay

## 2020-06-19 DIAGNOSIS — Z9889 Other specified postprocedural states: Secondary | ICD-10-CM

## 2020-06-21 ENCOUNTER — Other Ambulatory Visit: Payer: Self-pay

## 2020-06-21 ENCOUNTER — Encounter (HOSPITAL_COMMUNITY)
Admission: RE | Admit: 2020-06-21 | Discharge: 2020-06-21 | Disposition: A | Discharge status: Home / Self Care | Payer: BC Managed Care – PPO | Source: Ambulatory Visit | Attending: Cardiology | Admitting: Cardiology

## 2020-06-21 DIAGNOSIS — Z9889 Other specified postprocedural states: Secondary | ICD-10-CM | POA: Diagnosis not present

## 2020-06-23 ENCOUNTER — Other Ambulatory Visit: Payer: Self-pay

## 2020-06-23 ENCOUNTER — Encounter (HOSPITAL_COMMUNITY)
Admission: RE | Admit: 2020-06-23 | Discharge: 2020-06-23 | Disposition: A | Discharge status: Home / Self Care | Payer: BC Managed Care – PPO | Source: Ambulatory Visit | Attending: Cardiology | Admitting: Cardiology

## 2020-06-23 DIAGNOSIS — Z9889 Other specified postprocedural states: Secondary | ICD-10-CM

## 2020-06-26 ENCOUNTER — Encounter (HOSPITAL_COMMUNITY)
Admission: RE | Admit: 2020-06-26 | Discharge: 2020-06-26 | Disposition: A | Discharge status: Home / Self Care | Payer: BC Managed Care – PPO | Source: Ambulatory Visit | Attending: Cardiology | Admitting: Cardiology

## 2020-06-26 ENCOUNTER — Other Ambulatory Visit: Payer: Self-pay

## 2020-06-26 DIAGNOSIS — Z9889 Other specified postprocedural states: Secondary | ICD-10-CM | POA: Diagnosis not present

## 2020-06-26 NOTE — Progress Notes (Signed)
I have reviewed a Home Exercise Prescription with Kelsey [name] . Kelsey [name] is currently exercising at home. The patient was advised to continue to walk 3-4 days a week for 30-45 minutes.  Kelsey [name] and I discussed how to progress their exercise prescription. The patient stated that they understand the exercise prescription. We reviewed exercise guidelines, target heart rate during exercise, RPE Scale, weather conditions, NTG use, endpoints for exercise, warmup and cool down. Patient is encouraged to come to me with any questions. I will continue to follow up with the patient to assist them with progression and safety.    Carma Lair MS, CEP CSCS 2:36 PM 06/26/2020

## 2020-06-27 ENCOUNTER — Other Ambulatory Visit: Payer: Self-pay

## 2020-06-27 ENCOUNTER — Ambulatory Visit (INDEPENDENT_AMBULATORY_CARE_PROVIDER_SITE_OTHER): Payer: BC Managed Care – PPO | Admitting: Nurse Practitioner

## 2020-06-27 ENCOUNTER — Encounter: Payer: 59 | Admitting: Nurse Practitioner

## 2020-06-27 ENCOUNTER — Encounter: Payer: Self-pay | Admitting: Nurse Practitioner

## 2020-06-27 VITALS — BP 120/76 | Ht 62.0 in | Wt 143.0 lb

## 2020-06-27 DIAGNOSIS — Z90721 Acquired absence of ovaries, unilateral: Secondary | ICD-10-CM

## 2020-06-27 DIAGNOSIS — Z01419 Encounter for gynecological examination (general) (routine) without abnormal findings: Secondary | ICD-10-CM | POA: Diagnosis not present

## 2020-06-27 DIAGNOSIS — Z9079 Acquired absence of other genital organ(s): Secondary | ICD-10-CM

## 2020-06-27 DIAGNOSIS — Z90710 Acquired absence of both cervix and uterus: Secondary | ICD-10-CM | POA: Diagnosis not present

## 2020-06-27 DIAGNOSIS — M8589 Other specified disorders of bone density and structure, multiple sites: Secondary | ICD-10-CM

## 2020-06-27 DIAGNOSIS — Z780 Asymptomatic menopausal state: Secondary | ICD-10-CM

## 2020-06-27 NOTE — Patient Instructions (Signed)

## 2020-06-27 NOTE — Progress Notes (Signed)
   Kelsey [name] [birth year] [id number]   History:  59 y.o. [id number] presents for annual exam. 1999 [facility] LSO for DUB, no HRT. Normal pap and mammogram history. 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   Gynecologic History No LMP recorded. Patient has had a hysterectomy.   Last Pap: 2012. Results were: normal Last mammogram: 05/03/2020. Results were: normal Last colonoscopy: 2014. Results were: polyp, 10 year follow up Last Dexa: 08/03/2018. Results were: t-score -2.0, FRAX 8.7% / 1.1%  Past medical history, past surgical history, family history and social history were all reviewed and documented in the EPIC chart.  ROS:  A ROS was performed and pertinent positives and negatives are included.  Exam:  Vitals:   06/27/20 1030  BP: 120/76  Weight: 143 lb (64.9 kg)  Height: 5\' 2"  (1.575 m)   Body mass index is 26.16 kg/m.  General appearance:  Normal Thyroid:  Symmetrical, normal in size, without palpable masses or nodularity. Respiratory  Auscultation:  Clear without wheezing or rhonchi Cardiovascular  Auscultation:  Regular rate, without rubs, murmurs or gallops  Edema/varicosities:  Not grossly evident Abdominal  Soft,nontender, without masses, guarding or rebound.  Liver/spleen:  No organomegaly noted  Hernia:  None appreciated  Skin  Inspection:  Grossly normal. Well-healed scars on sternum and upper abdomen.    Breasts: Examined lying and sitting.   Right: Without masses, retractions, discharge or axillary adenopathy.   Left: Without masses, retractions, discharge or axillary adenopathy. Gentitourinary   Inguinal/mons:  Normal without inguinal adenopathy  External genitalia:  Normal  BUS/Urethra/Skene's glands:  Normal  Vagina:  Atrophic changes  Cervix:  Absent  Uterus:  Absent  Adnexa/parametria:     Rt: Without masses or tenderness.   Lt: Without masses or tenderness.  Anus and  perineum: Hemorrhoids  Digital rectal exam: Normal sphincter tone without palpated masses or tenderness  Assessment/Plan:  59 y.o. [id number] for annual exam.   Well female exam with routine gynecological exam - Education provided on SBEs, importance of preventative screenings, current guidelines, high calcium diet, regular exercise, and multivitamin daily. Labs with primary care. We will repeat pap smear at 10 year interval and consider stopping screenings per guidelines. We will reevaluate on an annual basis.   Postmenopausal - Plan: DG Bone Density  Osteopenia of multiple sites - Plan: DG Bone Density. Walks regularly, taking daily Vitamin D supplement. She will schedule bone density in the next couple of months.   History of total vaginal hysterectomy with LSO ([facility]) - 1999 for DUB. No HRT.   Follow up in 1 year for annual       [place], 10:52 AM 06/27/2020

## 2020-06-28 ENCOUNTER — Encounter (HOSPITAL_COMMUNITY): Payer: BC Managed Care – PPO

## 2020-06-28 NOTE — Progress Notes (Signed)
Cardiac Individual Treatment Plan  Patient Details  Name: Kelsey [name] MRN: [id number] Date of Birth: 1961/03/27 Referring Provider:     CARDIAC REHAB PHASE II ORIENTATION from 05/23/2020 in [place]  Referring Provider Sueanne Margarita, MD      Initial Encounter Date:    CARDIAC REHAB PHASE II ORIENTATION from 05/23/2020 in [place]  Date 05/23/20      Visit Diagnosis: S/P mitral valve repair  Patient's Home Medications on Admission:  Current Outpatient Medications:    aspirin 325 MG tablet, Take 325 mg by mouth daily., Disp: , Rfl:    Cholecalciferol (VITAMIN D) 2000 UNITS CAPS, Take 2,000 Units by mouth daily. , Disp: , Rfl:    CRANBERRY PO, Take 8,400 mg by mouth 2 (two) times daily. 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   History of diverticular abscess    Osteopenia 07/2018   T score -2.0 FRAX 8.7% / 1.1% stable from prior DEXA    Tobacco Use: Social History   Tobacco Use  Smoking Status Never Smoker  Smokeless Tobacco Never Used    Labs: Recent Review Scientist, physiological    Labs for ITP Cardiac and Pulmonary Rehab Latest Ref Rng & Units 01/17/2012   Cholestrol 0 - 200 mg/dL 288(H)   LDLCALC 0 - 99 mg/dL 207(H)   HDL >39 mg/dL 56   Trlycerides <150 mg/dL 124      Capillary Blood Glucose: No results found for: GLUCAP   Exercise Target Goals: Exercise Program Goal: Individual  exercise prescription set using results from initial 6 min walk test and THRR while considering  patients activity barriers and safety.   Exercise Prescription Goal: Starting with aerobic activity 30 plus minutes a day, 3 days per week for initial exercise prescription. Provide home exercise prescription and guidelines that participant acknowledges understanding prior to discharge.  Activity Barriers & Risk Stratification:  Activity Barriers & Cardiac Risk Stratification - 05/23/20 1024      Activity Barriers & Cardiac Risk Stratification   Activity Barriers None    Cardiac Risk Stratification Moderate           6 Minute Walk:  6 Minute Walk    Row Name 05/23/20 1046         6 Minute Walk   Phase Initial     Distance 1490 feet     Walk Time 6 minutes     # of Rest Breaks 0     MPH 2.82     METS 3.74     RPE 9     Perceived Dyspnea  0     VO2 Peak 13.08     Symptoms No     Resting HR 76 bpm     Resting BP 120/56     Resting Oxygen Saturation  97 %  Exercise Oxygen Saturation  during 6 min walk 99 %     Max Ex. HR 102 bpm     Max Ex. BP 118/80     2 Minute Post BP 102/78            Oxygen Initial Assessment:   Oxygen Re-Evaluation:   Oxygen Discharge (Final Oxygen Re-Evaluation):   Initial Exercise Prescription:  Initial Exercise Prescription - 05/23/20 1300      Date of Initial Exercise RX and Referring Provider   Date 05/23/20    Referring Provider Sueanne Margarita, MD    Expected Discharge Date 07/21/20      Treadmill   MPH 3    Grade 0    Minutes 15    METs 3.3      Bike   Level 2    Minutes 15    METs 3.3      Prescription Details   Frequency (times per week) 3    Duration Progress to 30 minutes of continuous aerobic without signs/symptoms of physical distress      Intensity   THRR 40-80% of Max Heartrate 64-129    Ratings of Perceived Exertion 11-13    Perceived Dyspnea 0-4      Progression   Progression Continue to progress  workloads to maintain intensity without signs/symptoms of physical distress.      Resistance Training   Training Prescription Yes    Weight 4lbs    Reps 10-15           Perform Capillary Blood Glucose checks as needed.  Exercise Prescription Changes:   Exercise Prescription Changes    Row Name 05/31/20 1300 06/07/20 1436 06/19/20 0732 06/26/20 1400       Response to Exercise   Blood Pressure (Admit) 110/62 128/70 120/78 110/58    Blood Pressure (Exercise) 128/70 162/78 158/80 120/64    Blood Pressure (Exit) 110/70 108/78 120/80 108/60    Heart Rate (Admit) 87 bpm 78 bpm 95 bpm 71 bpm    Heart Rate (Exercise) 121 bpm 125 bpm 127 bpm 127 bpm    Heart Rate (Exit) 75 bpm 82 bpm 87 bpm 82 bpm    Rating of Perceived Exertion (Exercise) 11 11 11 12     Perceived Dyspnea (Exercise) 0 0 0 0    Symptoms None None None None    Comments Pt's first day of exercise None None Reveiwed Home Exercise Plan    Duration Progress to 30 minutes of  aerobic without signs/symptoms of physical distress Progress to 30 minutes of  aerobic without signs/symptoms of physical distress Progress to 30 minutes of  aerobic without signs/symptoms of physical distress Continue with 30 min of aerobic exercise without signs/symptoms of physical distress.    Intensity THRR unchanged THRR unchanged THRR unchanged THRR unchanged      Progression   Progression Continue to progress workloads to maintain intensity without signs/symptoms of physical distress. Continue to progress workloads to maintain intensity without signs/symptoms of physical distress. Continue to progress workloads to maintain intensity without signs/symptoms of physical distress. Continue to progress workloads to maintain intensity without signs/symptoms of physical distress.    Average METs 3.1 3.5 4.4 4.6      Resistance Training   Training Prescription No No Yes Yes    Weight -- -- 5lbs 5lbs    Reps -- -- 10-15 10-15    Time -- -- 10 Minutes 10  Minutes      Interval Training   Interval Training -- --  No No      Treadmill   MPH 2.5 2.5 3 3     Grade 0 0 1 1    Minutes 15 15 15 15     METs 2.91 2.91 3.71 3.71      Bike   Level 2 2 2 2     Minutes 15 15 15 15     METs 3.3 4 5  5.4      Home Exercise Plan   Plans to continue exercise at -- -- -- Home (comment)  Walking    Frequency -- -- -- Add 3 additional days to program exercise sessions.    Initial Home Exercises Provided -- -- -- 06/26/20           Exercise Comments:   Exercise Comments    Row Name 05/31/20 1310 06/26/20 1436         Exercise Comments Pt's first day of cardiac rehab. Pt responded well to exercise prescription. Will continue to monitor and progress pt as tolerated. Reviewed Home Exercise Plan with pt. Pt will continue to walk at home 30-45 minutes in addition to cardiac rehab. Also reviewed METs with pt. Will continue to increase workloads as tolerated.             Exercise Goals and Review:   Exercise Goals    Row Name 05/23/20 1024             Exercise Goals   Increase Physical Activity Yes       Intervention Provide advice, education, support and counseling about physical activity/exercise needs.;Develop an individualized exercise prescription for aerobic and resistive training based on initial evaluation findings, risk stratification, comorbidities and participant's personal goals.       Expected Outcomes Short Term: Attend rehab on a regular basis to increase amount of physical activity.;Long Term: Exercising regularly at least 3-5 days a week.;Long Term: Add in home exercise to make exercise part of routine and to increase amount of physical activity.       Increase Strength and Stamina Yes       Intervention Provide advice, education, support and counseling about physical activity/exercise needs.;Develop an individualized exercise prescription for aerobic and resistive training based on initial evaluation findings, risk stratification,  comorbidities and participant's personal goals.       Expected Outcomes Short Term: Increase workloads from initial exercise prescription for resistance, speed, and METs.;Short Term: Perform resistance training exercises routinely during rehab and add in resistance training at home;Long Term: Improve cardiorespiratory fitness, muscular endurance and strength as measured by increased METs and functional capacity (6MWT)       Able to understand and use rate of perceived exertion (RPE) scale Yes       Intervention Provide education and explanation on how to use RPE scale       Expected Outcomes Short Term: Able to use RPE daily in rehab to express subjective intensity level;Long Term:  Able to use RPE to guide intensity level when exercising independently       Knowledge and understanding of Target Heart Rate Range (THRR) Yes       Intervention Provide education and explanation of THRR including how the numbers were predicted and where they are located for reference       Expected Outcomes Short Term: Able to state/look up THRR;Long Term: Able to use THRR to govern intensity when exercising independently;Short Term: Able to use daily as guideline for intensity in rehab       Able to check pulse  independently Yes       Intervention Provide education and demonstration on how to check pulse in carotid and radial arteries.;Review the importance of being able to check your own pulse for safety during independent exercise       Expected Outcomes Short Term: Able to explain why pulse checking is important during independent exercise;Long Term: Able to check pulse independently and accurately       Understanding of Exercise Prescription Yes       Intervention Provide education, explanation, and written materials on patient's individual exercise prescription       Expected Outcomes Short Term: Able to explain program exercise prescription;Long Term: Able to explain home exercise prescription to exercise  independently              Exercise Goals Re-Evaluation :  Exercise Goals Re-Evaluation    Row Name 05/31/20 1310 06/26/20 1439           Exercise Goal Re-Evaluation   Exercise Goals Review Increase Physical Activity;Able to understand and use rate of perceived exertion (RPE) scale;Knowledge and understanding of Target Heart Rate Range (THRR) Increase Physical Activity;Increase Strength and Stamina;Able to understand and use rate of perceived exertion (RPE) scale;Knowledge and understanding of Target Heart Rate Range (THRR);Able to check pulse independently;Understanding of Exercise Prescription      Comments Pt's first day of exercise. Pt oriented to equipment.  Pt able to exercise 30 minutes with minimal difficulty. Will continue to monitor. Reviewed HEP with pt. Also discussed THRR, RPE Scale, NTG use, endpoints of exercise, weather precautions, warmup and cool down.      Expected Outcomes Pt will continue to increase cardiovascular strength. Pt will continue to exercise most days of the week, including 2-3 days of walking at home for 45 minutes. Pt will continue to build strength and stamina.              Discharge Exercise Prescription (Final Exercise Prescription Changes):  Exercise Prescription Changes - 06/26/20 1400      Response to Exercise   Blood Pressure (Admit) 110/58    Blood Pressure (Exercise) 120/64    Blood Pressure (Exit) 108/60    Heart Rate (Admit) 71 bpm    Heart Rate (Exercise) 127 bpm    Heart Rate (Exit) 82 bpm    Rating of Perceived Exertion (Exercise) 12    Perceived Dyspnea (Exercise) 0    Symptoms None    Comments Reveiwed Home Exercise Plan    Duration Continue with 30 min of aerobic exercise without signs/symptoms of physical distress.    Intensity THRR unchanged      Progression   Progression Continue to progress workloads to maintain intensity without signs/symptoms of physical distress.    Average METs 4.6      Resistance Training    Training Prescription Yes    Weight 5lbs    Reps 10-15    Time 10 Minutes      Interval Training   Interval Training No      Treadmill   MPH 3    Grade 1    Minutes 15    METs 3.71      Bike   Level 2    Minutes 15    METs 5.4      Home Exercise Plan   Plans to continue exercise at Home (comment)   Walking   Frequency Add 3 additional days to program exercise sessions.    Initial Home Exercises Provided 06/26/20  Nutrition:  Target Goals: Understanding of nutrition guidelines, daily intake of sodium 1500mg , cholesterol 200mg , calories 30% from fat and 7% or less from saturated fats, daily to have 5 or more servings of fruits and vegetables.  Biometrics:  Pre Biometrics - 05/23/20 1026      Pre Biometrics   Waist Circumference 33 inches    Hip Circumference 39 inches    Waist to Hip Ratio 0.85 %    Triceps Skinfold 23.5 mm    % Body Fat 36.1 %    Grip Strength 28.5 kg    Flexibility 17.5 in    Single Leg Stand 20 seconds            Nutrition Therapy Plan and Nutrition Goals:  Nutrition Therapy & Goals - 06/07/20 1156      Nutrition Therapy   Diet Heart Healthy      Personal Nutrition Goals   Nutrition Goal Pt to build a healthy plate including vegetables, fruits, whole grains, and low-fat dairy products in a heart healthy meal plan.      Intervention Plan   Intervention Prescribe, educate and counsel regarding individualized specific dietary modifications aiming towards targeted core components such as weight, hypertension, lipid management, diabetes, heart failure and other comorbidities.    Expected Outcomes Short Term Goal: Understand basic principles of dietary content, such as calories, fat, sodium, cholesterol and nutrients.           Nutrition Assessments:  Nutrition Assessments - 06/01/20 1101      MEDFICTS Scores   Pre Score 18           Nutrition Goals Re-Evaluation:  Nutrition Goals Re-Evaluation    [place] Name 06/07/20  1157 06/27/20 0859           Goals   Current Weight 143 lb (64.9 kg) 142 lb 13.7 oz (64.8 kg)      Nutrition Goal Pt to build a healthy plate including vegetables, fruits, whole grains, and low-fat dairy products in a heart healthy meal plan. Pt to build a healthy plate including vegetables, fruits, whole grains, and low-fat dairy products in a heart healthy meal plan.             Nutrition Goals Discharge (Final Nutrition Goals Re-Evaluation):  Nutrition Goals Re-Evaluation - 06/27/20 0859      Goals   Current Weight 142 lb 13.7 oz (64.8 kg)    Nutrition Goal Pt to build a healthy plate including vegetables, fruits, whole grains, and low-fat dairy products in a heart healthy meal plan.           Psychosocial: Target Goals: Acknowledge presence or absence of significant depression and/or stress, maximize coping skills, provide positive support system. Participant is able to verbalize types and ability to use techniques and skills needed for reducing stress and depression.  Initial Review & Psychosocial Screening:  Initial Psych Review & Screening - 05/23/20 1506      Initial Review   Current issues with None Identified      Family Dynamics   Good Support System? Yes    Comments Ms. Catalano presents to cardiac rehab orientation with a positive attitude and outlook. She denies the need for psychosocial interventions. She admits to having a strong support system including her husband and her children.      Barriers   Psychosocial barriers to participate in program There are no identifiable barriers or psychosocial needs.      Screening Interventions   Interventions Encouraged to exercise  Quality of Life Scores:  Quality of Life - 05/23/20 1344      Quality of Life   Select Quality of Life      Quality of Life Scores   Health/Function Pre 27.2 %    Socioeconomic Pre 28.29 %    Psych/Spiritual Pre 26.57 %    Family Pre 28.8 %    GLOBAL Pre 27.53 %           Scores of 19 and below usually indicate a poorer quality of life in these areas.  A difference of  2-3 points is a clinically meaningful difference.  A difference of 2-3 points in the total score of the Quality of Life Index has been associated with significant improvement in overall quality of life, self-image, physical symptoms, and general health in studies assessing change in quality of life.  PHQ-9: Recent Review Flowsheet Data    Depression screen [facility] 2/9 05/23/2020   Decreased Interest 0   Down, Depressed, Hopeless 0   PHQ - 2 Score 0     Interpretation of Total Score  Total Score Depression Severity:  1-4 = Minimal depression, 5-9 = Mild depression, 10-14 = Moderate depression, 15-19 = Moderately severe depression, 20-27 = Severe depression   Psychosocial Evaluation and Intervention:   Psychosocial Re-Evaluation:  Psychosocial Re-Evaluation    [place] Name 05/31/20 1442 06/08/20 1701 06/28/20 1637         Psychosocial Re-Evaluation   Current issues with None Identified None Identified None Identified     Interventions Encouraged to attend Cardiac Rehabilitation for the exercise Encouraged to attend Cardiac Rehabilitation for the exercise Encouraged to attend Cardiac Rehabilitation for the exercise     Continue Psychosocial Services  No Follow up required No Follow up required No Follow up required            Psychosocial Discharge (Final Psychosocial Re-Evaluation):  Psychosocial Re-Evaluation - 06/28/20 1637      Psychosocial Re-Evaluation   Current issues with None Identified    Interventions Encouraged to attend Cardiac Rehabilitation for the exercise    Continue Psychosocial Services  No Follow up required           Vocational Rehabilitation: Provide vocational rehab assistance to qualifying candidates.   Vocational Rehab Evaluation & Intervention:  Vocational Rehab - 05/23/20 1509      Initial Vocational Rehab Evaluation & Intervention   Assessment shows  need for Vocational Rehabilitation No           Education: Education Goals: Education classes will be provided on a weekly basis, covering required topics. Participant will state understanding/return demonstration of topics presented.  Learning Barriers/Preferences:   Education Topics: Hypertension, Hypertension Reduction -Define heart disease and high blood pressure. Discus how high blood pressure affects the body and ways to reduce high blood pressure.   Exercise and Your Heart -Discuss why it is important to exercise, the FITT principles of exercise, normal and abnormal responses to exercise, and how to exercise safely.   Angina -Discuss definition of angina, causes of angina, treatment of angina, and how to decrease risk of having angina.   Cardiac Medications -Review what the following cardiac medications are used for, how they affect the body, and side effects that may occur when taking the medications.  Medications include Aspirin, Beta blockers, calcium channel blockers, ACE Inhibitors, angiotensin receptor blockers, diuretics, digoxin, and antihyperlipidemics.   Congestive Heart Failure -Discuss the definition of CHF, how to live with CHF, the signs and symptoms of  CHF, and how keep track of weight and sodium intake.   Heart Disease and Intimacy -Discus the effect sexual activity has on the heart, how changes occur during intimacy as we age, and safety during sexual activity.   Smoking Cessation / COPD -Discuss different methods to quit smoking, the health benefits of quitting smoking, and the definition of COPD.   Nutrition I: Fats -Discuss the types of cholesterol, what cholesterol does to the heart, and how cholesterol levels can be controlled.   Nutrition II: Labels -Discuss the different components of food labels and how to read food label   Heart Parts/Heart Disease and PAD -Discuss the anatomy of the heart, the pathway of blood circulation through the  heart, and these are affected by heart disease.   Stress I: Signs and Symptoms -Discuss the causes of stress, how stress may lead to anxiety and depression, and ways to limit stress.   Stress II: Relaxation -Discuss different types of relaxation techniques to limit stress.   Warning Signs of Stroke / TIA -Discuss definition of a stroke, what the signs and symptoms are of a stroke, and how to identify when someone is having stroke.   Knowledge Questionnaire Score:  Knowledge Questionnaire Score - 05/23/20 1345      Knowledge Questionnaire Score   Pre Score 18/24           Core Components/Risk Factors/Patient Goals at Admission:  Personal Goals and Risk Factors at Admission - 05/23/20 1024      Core Components/Risk Factors/Patient Goals on Admission   Hypertension Yes    Intervention Provide education on lifestyle modifcations including regular physical activity/exercise, weight management, moderate sodium restriction and increased consumption of fresh fruit, vegetables, and low fat dairy, alcohol moderation, and smoking cessation.;Monitor prescription use compliance.    Expected Outcomes Short Term: Continued assessment and intervention until BP is < 140/26mm HG in hypertensive participants. < 130/56mm HG in hypertensive participants with diabetes, heart failure or chronic kidney disease.;Long Term: Maintenance of blood pressure at goal levels.    Lipids Yes    Intervention Provide education and support for participant on nutrition & aerobic/resistive exercise along with prescribed medications to achieve LDL 70mg , HDL >40mg .    Expected Outcomes Short Term: Participant states understanding of desired cholesterol values and is compliant with medications prescribed. Participant is following exercise prescription and nutrition guidelines.;Long Term: Cholesterol controlled with medications as prescribed, with individualized exercise RX and with personalized nutrition plan. Value goals:  LDL < 70mg , HDL > 40 mg.    Stress Yes    Intervention Offer individual and/or small group education and counseling on adjustment to heart disease, stress management and health-related lifestyle change. Teach and support self-help strategies.;Refer participants experiencing significant psychosocial distress to appropriate mental health specialists for further evaluation and treatment. When possible, include family members and significant others in education/counseling sessions.    Expected Outcomes Short Term: Participant demonstrates changes in health-related behavior, relaxation and other stress management skills, ability to obtain effective social support, and compliance with psychotropic medications if prescribed.;Long Term: Emotional wellbeing is indicated by absence of clinically significant psychosocial distress or social isolation.           Core Components/Risk Factors/Patient Goals Review:   Goals and Risk Factor Review    Row Name 05/31/20 1442 06/08/20 1701 06/28/20 1637         Core Components/Risk Factors/Patient Goals Review   Personal Goals Review Hypertension;Lipids;Stress Hypertension;Lipids;Stress Hypertension;Lipids;Stress     Review Meaghann started exercise on  05/31/20 and exercised without difficulty. Onie has been doing well with exercise. Khalis's vital signs have been stable Lyndee has been doing well with exercise. Aleshka's vital signs have been stable     Expected Outcomes Patient will continue to participate in phase 2 for exercise, nutrtion and lifestyle modifications Patient will continue to participate in phase 2 for exercise, nutrtion and lifestyle modifications Patient will continue to participate in phase 2 for exercise, nutrtion and lifestyle modifications            Core Components/Risk Factors/Patient Goals at Discharge (Final Review):   Goals and Risk Factor Review - 06/28/20 1637      Core Components/Risk Factors/Patient Goals Review   Personal  Goals Review Hypertension;Lipids;Stress    Review Helaina has been doing well with exercise. Raynie's vital signs have been stable    Expected Outcomes Patient will continue to participate in phase 2 for exercise, nutrtion and lifestyle modifications           ITP Comments:  ITP Comments    Row Name 05/23/20 1026 05/31/20 1450 06/08/20 1659 06/28/20 1636     ITP Comments Dr. Fransico Him, Medical Director Cardiac Rehab [place] 30 Day ITP Review. Yunuen started exercise on 05/31/20 and did well with exercise. 30 Day ITP Review. Patient has good attendance and participation in phase 2 cardiac rehab so far 30 Day ITP [facility]. Rayanne has good participation and fair attendance in phase 2 cardiac rehab           Comments: See ITP comments.Barnet Pall, RN,BSN 06/29/2020 12:08 PM

## 2020-06-30 ENCOUNTER — Encounter (HOSPITAL_COMMUNITY): Payer: BC Managed Care – PPO

## 2020-07-03 ENCOUNTER — Encounter (HOSPITAL_COMMUNITY)
Admission: RE | Admit: 2020-07-03 | Discharge: 2020-07-03 | Disposition: A | Discharge status: Home / Self Care | Payer: BC Managed Care – PPO | Source: Ambulatory Visit | Attending: Cardiology | Admitting: Cardiology

## 2020-07-03 ENCOUNTER — Other Ambulatory Visit: Payer: Self-pay

## 2020-07-03 DIAGNOSIS — Z9889 Other specified postprocedural states: Secondary | ICD-10-CM | POA: Diagnosis present

## 2020-07-05 ENCOUNTER — Encounter (HOSPITAL_COMMUNITY)
Admission: RE | Admit: 2020-07-05 | Discharge: 2020-07-05 | Disposition: A | Discharge status: Home / Self Care | Payer: BC Managed Care – PPO | Source: Ambulatory Visit | Attending: Cardiology | Admitting: Cardiology

## 2020-07-05 ENCOUNTER — Other Ambulatory Visit: Payer: Self-pay

## 2020-07-05 DIAGNOSIS — Z9889 Other specified postprocedural states: Secondary | ICD-10-CM

## 2020-07-07 ENCOUNTER — Other Ambulatory Visit: Payer: Self-pay

## 2020-07-07 ENCOUNTER — Encounter (HOSPITAL_COMMUNITY)
Admission: RE | Admit: 2020-07-07 | Discharge: 2020-07-07 | Disposition: A | Discharge status: Home / Self Care | Payer: BC Managed Care – PPO | Source: Ambulatory Visit | Attending: Cardiology | Admitting: Cardiology

## 2020-07-07 DIAGNOSIS — Z9889 Other specified postprocedural states: Secondary | ICD-10-CM | POA: Diagnosis not present

## 2020-07-10 ENCOUNTER — Encounter (HOSPITAL_COMMUNITY)
Admission: RE | Admit: 2020-07-10 | Discharge: 2020-07-10 | Disposition: A | Discharge status: Home / Self Care | Payer: BC Managed Care – PPO | Source: Ambulatory Visit | Attending: Cardiology | Admitting: Cardiology

## 2020-07-10 ENCOUNTER — Other Ambulatory Visit: Payer: Self-pay

## 2020-07-10 DIAGNOSIS — Z9889 Other specified postprocedural states: Secondary | ICD-10-CM

## 2020-07-12 ENCOUNTER — Other Ambulatory Visit: Payer: Self-pay

## 2020-07-12 ENCOUNTER — Encounter (HOSPITAL_COMMUNITY)
Admission: RE | Admit: 2020-07-12 | Discharge: 2020-07-12 | Disposition: A | Discharge status: Home / Self Care | Payer: BC Managed Care – PPO | Source: Ambulatory Visit | Attending: Cardiology | Admitting: Cardiology

## 2020-07-12 VITALS — Ht 62.5 in | Wt 143.3 lb

## 2020-07-12 DIAGNOSIS — Z9889 Other specified postprocedural states: Secondary | ICD-10-CM | POA: Diagnosis not present

## 2020-07-14 ENCOUNTER — Other Ambulatory Visit: Payer: Self-pay

## 2020-07-14 ENCOUNTER — Encounter (HOSPITAL_COMMUNITY)
Admission: RE | Admit: 2020-07-14 | Discharge: 2020-07-14 | Disposition: A | Discharge status: Home / Self Care | Payer: BC Managed Care – PPO | Source: Ambulatory Visit | Attending: Cardiology | Admitting: Cardiology

## 2020-07-14 DIAGNOSIS — Z9889 Other specified postprocedural states: Secondary | ICD-10-CM

## 2020-07-14 NOTE — Progress Notes (Signed)
Discharge Progress Report  Patient Details  Name: Kelsey [name] MRN: [id number] Date of Birth: 1961/07/10 Referring Provider:     B[facility] from 05/23/2020 in [facility]  Referring Provider Sueanne Margarita, MD       Number of Visits: 14  Reason for Discharge:  Patient reached a stable level of exercise. Patient independent in their exercise. Patient has met program and personal goals.  Smoking History:  Social History   Tobacco Use  Smoking Status Never Smoker  Smokeless Tobacco Never Used    Diagnosis:  S/P mitral valve repair  ADL UCSD:   Initial Exercise Prescription:  Initial Exercise Prescription - 05/23/20 1300      Date of Initial Exercise RX and Referring Provider   Date 05/23/20    Referring Provider Sueanne Margarita, MD    Expected Discharge Date 07/21/20      Treadmill   MPH 3    Grade 0    Minutes 15    METs 3.3      Bike   Level 2    Minutes 15    METs 3.3      Prescription Details   Frequency (times per week) 3    Duration Progress to 30 minutes of continuous aerobic without signs/symptoms of physical distress      Intensity   THRR 40-80% of Max Heartrate 64-129    Ratings of Perceived Exertion 11-13    Perceived Dyspnea 0-4      Progression   Progression Continue to progress workloads to maintain intensity without signs/symptoms of physical distress.      Resistance Training   Training Prescription Yes    Weight 4lbs    Reps 10-15           Discharge Exercise Prescription (Final Exercise Prescription Changes):  Exercise Prescription Changes - 07/14/20 1617      Response to Exercise   Blood Pressure (Admit) 118/78    Blood Pressure (Exercise) 168/82    Blood Pressure (Exit) 104/64    Heart Rate (Admit) 76 bpm    Heart Rate (Exercise) 132 bpm    Heart Rate (Exit) 83 bpm    Rating of Perceived Exertion (Exercise) 12    Perceived Dyspnea (Exercise) 0    Symptoms None      Comments Pt's last day of exercise    Duration Continue with 45 min of aerobic exercise without signs/symptoms of physical distress.    Intensity THRR unchanged      Progression   Progression Continue to progress workloads to maintain intensity without signs/symptoms of physical distress.    Average METs 5.6      Resistance Training   Training Prescription Yes    Weight 5lbs    Reps 10-15    Time 10 Minutes      Interval Training   Interval Training No      Treadmill   MPH 3    Grade 3    Minutes 15    METs 4.54      Bike   Level 2.5    Minutes 15    METs 6.7      Home Exercise Plan   Plans to continue exercise at Home (comment)   Walking   Frequency Add 3 additional days to program exercise sessions.    Initial Home Exercises Provided 06/26/20           Functional Capacity:  6 Minute Walk  Mellen Name 05/23/20 1046 07/13/20 1408       6 Minute Walk   Phase Initial Discharge    Distance 1490 feet 1680 feet    Distance % Change -- 12.75 %    Distance Feet Change -- 190 ft    Walk Time 6 minutes 6 minutes    # of Rest Breaks 0 0    MPH 2.82 3.2    METS 3.74 4.6    RPE 9 6    Perceived Dyspnea  0 0    VO2 Peak 13.08 16    Symptoms No No    Resting HR 76 bpm 82 bpm    Resting BP 120/56 124/54    Resting Oxygen Saturation  97 % 96 %    Exercise Oxygen Saturation  during 6 min walk 99 % 95 %    Max Ex. HR 102 bpm 120 bpm    Max Ex. BP 118/80 156/78    2 Minute Post BP 102/78 104/56           Psychological, QOL, Others - Outcomes: PHQ 2/9: Depression screen [facility] 2/9 07/14/2020 05/23/2020  Decreased Interest 0 0  Down, Depressed, Hopeless 0 0  PHQ - 2 Score 0 0    Quality of Life:  Quality of Life - 07/26/20 1627      Quality of Life Scores   Health/Function Pre 27.2 %    Health/Function Post 28.29 %    Health/Function % Change 4.01 %    Socioeconomic Pre 28.29 %    Socioeconomic Post 29 %    Socioeconomic % Change  2.51 %     Psych/Spiritual Pre 26.57 %    Psych/Spiritual Post 29.14 %    Psych/Spiritual % Change 9.67 %    Family Pre 28.8 %    Family Post 28.8 %    Family % Change 0 %    GLOBAL Pre 27.53 %    GLOBAL Post 28.69 %    GLOBAL % Change 4.21 %           Personal Goals: Goals established at orientation with interventions provided to work toward goal.  Personal Goals and Risk Factors at Admission - 05/23/20 1024      Core Components/Risk Factors/Patient Goals on Admission   Hypertension Yes    Intervention Provide education on lifestyle modifcations including regular physical activity/exercise, weight management, moderate sodium restriction and increased consumption of fresh fruit, vegetables, and low fat dairy, alcohol moderation, and smoking cessation.;Monitor prescription use compliance.    Expected Outcomes Short Term: Continued assessment and intervention until BP is < 140/75m HG in hypertensive participants. < 130/843mHG in hypertensive participants with diabetes, heart failure or chronic kidney disease.;Long Term: Maintenance of blood pressure at goal levels.    Lipids Yes    Intervention Provide education and support for participant on nutrition & aerobic/resistive exercise along with prescribed medications to achieve LDL <7079mHDL >70m73m  Expected Outcomes Short Term: Participant states understanding of desired cholesterol values and is compliant with medications prescribed. Participant is following exercise prescription and nutrition guidelines.;Long Term: Cholesterol controlled with medications as prescribed, with individualized exercise RX and with personalized nutrition plan. Value goals: LDL < 70mg39mL > 40 mg.    Stress Yes    Intervention Offer individual and/or small group education and counseling on adjustment to heart disease, stress management and health-related lifestyle change. Teach and support self-help strategies.;Refer participants experiencing significant psychosocial  distress to appropriate mental health  specialists for further evaluation and treatment. When possible, include family members and significant others in education/counseling sessions.    Expected Outcomes Short Term: Participant demonstrates changes in health-related behavior, relaxation and other stress management skills, ability to obtain effective social support, and compliance with psychotropic medications if prescribed.;Long Term: Emotional wellbeing is indicated by absence of clinically significant psychosocial distress or social isolation.            Personal Goals Discharge:  Goals and Risk Factor Review    Row Name 05/31/20 1442 06/08/20 1701 06/28/20 1637 07/14/20 1329       Core Components/Risk Factors/Patient Goals Review   Personal Goals Review Hypertension;Lipids;Stress Hypertension;Lipids;Stress Hypertension;Lipids;Stress Hypertension;Lipids;Stress    Review Kelsey [name] started exercise on 05/31/20 and exercised without difficulty. Kelsey [name] has been doing well with exercise. Kelsey [name]'s vital signs have been stable Kelsey [name] has been doing well with exercise. Kelsey [name]'s vital signs have been stable Kelsey [name] did well with exercise.Kelsey [name] completed exercise on 07/14/20. Kelsey [name] plans to continue exercise by walking    Expected Outcomes Patient will continue to participate in phase 2 for exercise, nutrtion and lifestyle modifications Patient will continue to participate in phase 2 for exercise, nutrtion and lifestyle modifications Patient will continue to participate in phase 2 for exercise, nutrtion and lifestyle modifications Kelsey [name] will continue to exercise by walking following nutrtion and lifestyle modifications upon completion of phase 2 cardiac rehab           Exercise Goals and Review:  Exercise Goals    Row Name 05/23/20 1024             Exercise Goals   Increase Physical Activity Yes       Intervention Provide advice, education, support and counseling about physical  activity/exercise needs.;Develop an individualized exercise prescription for aerobic and resistive training based on initial evaluation findings, risk stratification, comorbidities and participant's personal goals.       Expected Outcomes Short Term: Attend rehab on a regular basis to increase amount of physical activity.;Long Term: Exercising regularly at least 3-5 days a week.;Long Term: Add in home exercise to make exercise part of routine and to increase amount of physical activity.       Increase Strength and Stamina Yes       Intervention Provide advice, education, support and counseling about physical activity/exercise needs.;Develop an individualized exercise prescription for aerobic and resistive training based on initial evaluation findings, risk stratification, comorbidities and participant's personal goals.       Expected Outcomes Short Term: Increase workloads from initial exercise prescription for resistance, speed, and METs.;Short Term: Perform resistance training exercises routinely during rehab and add in resistance training at home;Long Term: Improve cardiorespiratory fitness, muscular endurance and strength as measured by increased METs and functional capacity (6MWT)       Able to understand and use rate of perceived exertion (RPE) scale Yes       Intervention Provide education and explanation on how to use RPE scale       Expected Outcomes Short Term: Able to use RPE daily in rehab to express subjective intensity level;Long Term:  Able to use RPE to guide intensity level when exercising independently       Knowledge and understanding of Target Heart Rate Range (THRR) Yes       Intervention Provide education and explanation of THRR including how the numbers were predicted and where they are located for reference       Expected Outcomes Short Term: Able to state/look up THRR;Long Term:  Able to use THRR to govern intensity when exercising independently;Short Term: Able to use daily as  guideline for intensity in rehab       Able to check pulse independently Yes       Intervention Provide education and demonstration on how to check pulse in carotid and radial arteries.;Review the importance of being able to check your own pulse for safety during independent exercise       Expected Outcomes Short Term: Able to explain why pulse checking is important during independent exercise;Long Term: Able to check pulse independently and accurately       Understanding of Exercise Prescription Yes       Intervention Provide education, explanation, and written materials on patient's individual exercise prescription       Expected Outcomes Short Term: Able to explain program exercise prescription;Long Term: Able to explain home exercise prescription to exercise independently              Exercise Goals Re-Evaluation:  Exercise Goals Re-Evaluation    Row Name 05/31/20 1310 06/26/20 1439 07/26/20 1624         Exercise Goal Re-Evaluation   Exercise Goals Review Increase Physical Activity;Able to understand and use rate of perceived exertion (RPE) scale;Knowledge and understanding of Target Heart Rate Range (THRR) Increase Physical Activity;Increase Strength and Stamina;Able to understand and use rate of perceived exertion (RPE) scale;Knowledge and understanding of Target Heart Rate Range (THRR);Able to check pulse independently;Understanding of Exercise Prescription Increase Physical Activity;Increase Strength and Stamina;Able to understand and use rate of perceived exertion (RPE) scale;Knowledge and understanding of Target Heart Rate Range (THRR);Able to check pulse independently;Understanding of Exercise Prescription     Comments Pt's first day of exercise. Pt oriented to equipment.  Pt able to exercise 30 minutes with minimal difficulty. Will continue to monitor. Reviewed HEP with pt. Also discussed THRR, RPE Scale, NTG use, endpoints of exercise, weather precautions, warmup and cool down. Pt  completed 14 sessions of Cardiac Rehab. Pt increased her functional capacity by 12.75%, increased her post 26mt distance by 1951f. Pt did not have any specific goals for attending rehab, but did feel like she is stronger and motivated to continue to exercise.     Expected Outcomes Pt will continue to increase cardiovascular strength. Pt will continue to exercise most days of the week, including 2-3 days of walking at home for 45 minutes. Pt will continue to build strength and stamina. Pt will walk 4-5 days a week for 30-45 minutes to continuing to exercise.            Nutrition & Weight - Outcomes:  Pre Biometrics - 05/23/20 1026      Pre Biometrics   Waist Circumference 33 inches    Hip Circumference 39 inches    Waist to Hip Ratio 0.85 %    Triceps Skinfold 23.5 mm    % Body Fat 36.1 %    Grip Strength 28.5 kg    Flexibility 17.5 in    Single Leg Stand 20 seconds           Post Biometrics - 07/13/20 1409       Post  Biometrics   Height 5' 2.5" (1.588 m)    Weight 65 kg    Waist Circumference 33 inches    Hip Circumference 39 inches    Waist to Hip Ratio 0.85 %    BMI (Calculated) 25.78    Triceps Skinfold 22.5 mm    % Body Fat 35.7 %  Grip Strength 35 kg    Flexibility 18.5 in    Single Leg Stand 25.56 seconds           Nutrition:  Nutrition Therapy & Goals - 06/07/20 1156      Nutrition Therapy   Diet Heart Healthy      Personal Nutrition Goals   Nutrition Goal Pt to build a healthy plate including vegetables, fruits, whole grains, and low-fat dairy products in a heart healthy meal plan.      Intervention Plan   Intervention Prescribe, educate and counsel regarding individualized specific dietary modifications aiming towards targeted core components such as weight, hypertension, lipid management, diabetes, heart failure and other comorbidities.    Expected Outcomes Short Term Goal: Understand basic principles of dietary content, such as calories, fat,  sodium, cholesterol and nutrients.           Nutrition Discharge:  Nutrition Assessments - 06/01/20 1101      MEDFICTS Scores   Pre Score 18           Education Questionnaire Score:  Knowledge Questionnaire Score - 07/26/20 1529      Knowledge Questionnaire Score   Pre Score 18/24    Post Score 23/24           Goals reviewed with patient; copy given to patient.Kelsey [name] graduated from cardiac rehab program on 07/14/20 with completion of 14 exercise sessions in Phase II. Pt maintained good attendance and progressed nicely during his participation in rehab as evidenced by increased MET level.   Medication list reconciled. Repeat  PHQ score- 0 .  Pt has made significant lifestyle changes and should be commended for her success. Pt feels she has achieved her goals during cardiac rehab.   Pt plans to continue exercise by walking with her husband. Linde increased her distance on her post exercise walk test by 190 feet. We are proudof Flavia's progress!Kelsey Pall, RN,BSN 08/01/2020 12:28 PM

## 2020-07-17 ENCOUNTER — Encounter (HOSPITAL_COMMUNITY): Payer: BC Managed Care – PPO

## 2020-07-19 ENCOUNTER — Encounter (HOSPITAL_COMMUNITY): Payer: BC Managed Care – PPO

## 2020-07-21 ENCOUNTER — Encounter (HOSPITAL_COMMUNITY): Payer: BC Managed Care – PPO

## 2020-08-01 NOTE — Addendum Note (Signed)
Encounter addended by: Magda Kiel, RN on: 08/01/2020 12:30 PM  Actions taken: Clinical Note Signed

## 2020-08-09 ENCOUNTER — Ambulatory Visit (INDEPENDENT_AMBULATORY_CARE_PROVIDER_SITE_OTHER): Payer: BC Managed Care – PPO

## 2020-08-09 ENCOUNTER — Other Ambulatory Visit: Payer: Self-pay

## 2020-08-09 ENCOUNTER — Other Ambulatory Visit: Payer: Self-pay | Admitting: Nurse Practitioner

## 2020-08-09 DIAGNOSIS — M8589 Other specified disorders of bone density and structure, multiple sites: Secondary | ICD-10-CM | POA: Diagnosis not present

## 2020-08-09 DIAGNOSIS — Z780 Asymptomatic menopausal state: Secondary | ICD-10-CM

## 2021-03-30 ENCOUNTER — Other Ambulatory Visit: Payer: Self-pay | Admitting: Family Medicine

## 2021-03-30 DIAGNOSIS — Z1231 Encounter for screening mammogram for malignant neoplasm of breast: Secondary | ICD-10-CM

## 2021-05-24 ENCOUNTER — Other Ambulatory Visit: Payer: Self-pay

## 2021-05-24 ENCOUNTER — Ambulatory Visit
Admission: RE | Admit: 2021-05-24 | Discharge: 2021-05-24 | Disposition: A | Discharge status: Home / Self Care | Payer: BC Managed Care – PPO | Source: Ambulatory Visit | Attending: Family Medicine | Admitting: Family Medicine

## 2021-05-24 DIAGNOSIS — Z1231 Encounter for screening mammogram for malignant neoplasm of breast: Secondary | ICD-10-CM

## 2021-06-28 ENCOUNTER — Other Ambulatory Visit: Payer: Self-pay

## 2021-06-28 ENCOUNTER — Encounter: Payer: Self-pay | Admitting: Nurse Practitioner

## 2021-06-28 ENCOUNTER — Ambulatory Visit (INDEPENDENT_AMBULATORY_CARE_PROVIDER_SITE_OTHER): Payer: BC Managed Care – PPO | Admitting: Nurse Practitioner

## 2021-06-28 VITALS — BP 130/78 | Ht 62.0 in | Wt 155.0 lb

## 2021-06-28 DIAGNOSIS — Z780 Asymptomatic menopausal state: Secondary | ICD-10-CM

## 2021-06-28 DIAGNOSIS — Z01419 Encounter for gynecological examination (general) (routine) without abnormal findings: Secondary | ICD-10-CM

## 2021-06-28 DIAGNOSIS — M8589 Other specified disorders of bone density and structure, multiple sites: Secondary | ICD-10-CM

## 2021-06-28 NOTE — Progress Notes (Signed)
   Kelsey [name] 1961-01-27 [id number]   History:  60 y.o. [id number] presents for annual exam. 1999 TVH LSO for DUB, no HRT. High risk HPV at age 47, subsequent paps normal. History of osteopenia, DVT, mitral valve replacement in April 2021.  Gynecologic History No LMP recorded. Patient has had a hysterectomy.   Health maintenance Last Pap: 2012. Results were: normal Last mammogram: 05/24/2021. Results were: normal Last colonoscopy: 2014. Results were: polyp, 10 year follow up Last Dexa: 08/09/2020. Results were: t-score -2.3, FRAX 10% / 1.7%  Past medical history, past surgical history, family history and social history were all reviewed and documented in the EPIC chart. Married. Retired. 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   ROS:  A ROS was performed and pertinent positives and negatives are included.  Exam:  Vitals:   06/28/21 1031  BP: 130/78  Weight: 155 lb (70.3 kg)  Height: 5\' 2"  (1.575 m)    Body mass index is 28.35 kg/m.  General appearance:  Normal Thyroid:  Symmetrical, normal in size, without palpable masses or nodularity. Respiratory  Auscultation:  Clear without wheezing or rhonchi Cardiovascular  Auscultation:  Regular rate, without rubs, murmurs or gallops  Edema/varicosities:  Not grossly evident Abdominal  Soft,nontender, without masses, guarding or rebound.  Liver/spleen:  No organomegaly noted  Hernia:  None appreciated  Skin  Inspection:  Grossly normal. Well-healed scars on sternum and upper abdomen.    Breasts: Examined lying and sitting.   Right: Without masses, retractions, discharge or axillary adenopathy.   Left: Without masses, retractions, discharge or axillary adenopathy. Gentitourinary   Inguinal/mons:  Normal without inguinal adenopathy  External genitalia:  Normal  BUS/Urethra/Skene's glands:  Normal  Vagina:  Atrophic changes  Cervix:  Absent  Uterus:  Absent  Adnexa/parametria:      Rt: Without masses or tenderness.   Lt: Without masses or tenderness.  Anus and perineum: Hemorrhoids  Digital rectal exam: Normal sphincter tone without palpated masses or tenderness  Patient informed chaperone available to be present for breast and pelvic exam. Patient has requested no chaperone to be present. Patient has been advised what will be completed during breast and pelvic exam.   Assessment/Plan:  60 y.o. [id number] for annual exam.   Well female exam with routine gynecological exam - Education provided on SBEs, importance of preventative screenings, current guidelines, high calcium diet, regular exercise, and multivitamin daily.  Labs with PCP.   Postmenopausal - no HRT.   Osteopenia of multiple sites - Most recent T-score -2.3 without elevated FRAX. Continue Vitamin D supplement and high calcium diet. She walks daily. Will plan to repeat Dexa in 1 year.   Screening for cervical cancer - No longer screening per guidelines.   Screening for breast cancer - Normal mammogram history.  Continue annual screenings.  Normal breast exam today.  Screening for colon cancer - 2014 colonoscopy. Will repeat at GI's recommended interval.   Follow up in 1 year for annual.      [name] [facility], 10:51 AM 06/28/2021

## 2022-04-23 ENCOUNTER — Other Ambulatory Visit: Payer: Self-pay | Admitting: Family Medicine

## 2022-04-23 DIAGNOSIS — Z1231 Encounter for screening mammogram for malignant neoplasm of breast: Secondary | ICD-10-CM

## 2022-05-30 ENCOUNTER — Ambulatory Visit
Admission: RE | Admit: 2022-05-30 | Discharge: 2022-05-30 | Disposition: A | Discharge status: Home / Self Care | Payer: 59 | Source: Ambulatory Visit | Attending: Family Medicine | Admitting: Family Medicine

## 2022-05-30 ENCOUNTER — Ambulatory Visit: Payer: BC Managed Care – PPO

## 2022-05-30 DIAGNOSIS — Z1231 Encounter for screening mammogram for malignant neoplasm of breast: Secondary | ICD-10-CM

## 2022-07-01 NOTE — Progress Notes (Signed)
   Kelsey [name] March 16, 1961 [id number]   History:  61 y.o. [id number] presents for annual exam. Postmenopausal - no HRT. S/P 1999 TVH LSO for DUB. High risk HPV at age 61, subsequent paps normal. History of osteopenia, DVT, mitral valve replacement in April 2021.  Gynecologic History No LMP recorded. Patient has had a hysterectomy.   Sexually active: Yes  Health maintenance Last Pap: 01/14/2011. Results were: Normal neg HPV Last mammogram: 05/30/2022 Results were: Normal Last colonoscopy: 05/28/2022. Results were: Normal, 7-year recall Last Dexa: 08/09/2020. Results were: T-score -2.3, FRAX 10% / 1.7%  Past medical history, past surgical history, family history and social history were all reviewed and documented in the EPIC chart. Married. Retired. 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  ROS:  A ROS was performed and pertinent positives and negatives are included.  Exam:  Vitals:   07/02/22 1033  BP: 124/78  Weight: 152 lb (68.9 kg)  Height: 5' 1.5" (1.562 m)     Body mass index is 28.26 kg/m.  General appearance:  Normal Thyroid:  Symmetrical, normal in size, without palpable masses or nodularity. Respiratory  Auscultation:  Clear without wheezing or rhonchi Cardiovascular  Auscultation:  Regular rate, without rubs, murmurs or gallops  Edema/varicosities:  Not grossly evident Abdominal  Soft,nontender, without masses, guarding or rebound.  Liver/spleen:  No organomegaly noted  Hernia:  None appreciated  Skin  Inspection:  Grossly normal. Well-healed scars on sternum and upper abdomen.    Breasts: Examined lying and sitting.   Right: Without masses, retractions, discharge or axillary adenopathy.   Left: Without masses, retractions, discharge or axillary adenopathy. Gentitourinary   Inguinal/mons:  Normal without inguinal adenopathy  External genitalia:  Normal  BUS/Urethra/Skene's glands:  Normal  Vagina:  Atrophic changes  Cervix:   Absent  Uterus:  Absent  Adnexa/parametria:     Rt: Without masses or tenderness.   Lt: Without masses or tenderness.  Anus and perineum: Hemorrhoids  Digital rectal exam: Deferred  Patient informed chaperone available to be present for breast and pelvic exam. Patient has requested no chaperone to be present. Patient has been advised what will be completed during breast and pelvic exam.   Assessment/Plan:  61 y.o. [id number] for annual exam.   Well female exam with routine gynecological exam - Education provided on SBEs, importance of preventative screenings, current guidelines, high calcium diet, regular exercise, and multivitamin daily.  Labs with PCP.   Postmenopausal - no HRT. Does have mild hot flashes.   Osteopenia of multiple sites - Most recent T-score -2.3 without elevated FRAX. Continue Vitamin D supplement and high calcium diet. She walks daily. Will repeat DXA next month.   Screening for cervical cancer - High risk HPV at age 61, subsequent paps normal. No longer screening per guidelines.   Screening for breast cancer - Normal mammogram history.  Continue annual screenings.  Normal breast exam today.  Screening for colon cancer - 04/2022  colonoscopy. Will repeat at GI's recommended interval.   Follow up in 1 year for annual.      [name] [facility], 11:10 AM 07/02/2022

## 2022-07-02 ENCOUNTER — Ambulatory Visit (INDEPENDENT_AMBULATORY_CARE_PROVIDER_SITE_OTHER): Payer: 59 | Admitting: Nurse Practitioner

## 2022-07-02 ENCOUNTER — Encounter: Payer: Self-pay | Admitting: Nurse Practitioner

## 2022-07-02 VITALS — BP 124/78 | Ht 61.5 in | Wt 152.0 lb

## 2022-07-02 DIAGNOSIS — M8589 Other specified disorders of bone density and structure, multiple sites: Secondary | ICD-10-CM | POA: Diagnosis not present

## 2022-07-02 DIAGNOSIS — Z01419 Encounter for gynecological examination (general) (routine) without abnormal findings: Secondary | ICD-10-CM | POA: Diagnosis not present

## 2022-07-02 DIAGNOSIS — Z780 Asymptomatic menopausal state: Secondary | ICD-10-CM

## 2022-09-02 ENCOUNTER — Ambulatory Visit: Payer: 59 | Admitting: Podiatry

## 2022-10-10 IMAGING — MG MM DIGITAL SCREENING BILAT W/ TOMO AND CAD
8 series · 9 of 24 positions shown · non-contrast
Comparison: Previous exam(s).

CLINICAL DATA: Screening.

EXAM:
DIGITAL SCREENING BILATERAL MAMMOGRAM WITH TOMOSYNTHESIS AND CAD
TECHNIQUE: Bilateral screening digital craniocaudal and mediolateral oblique
mammograms were obtained. Bilateral screening digital breast
tomosynthesis was performed. The images were evaluated with
computer-aided detection.

[R CC synth-2D]
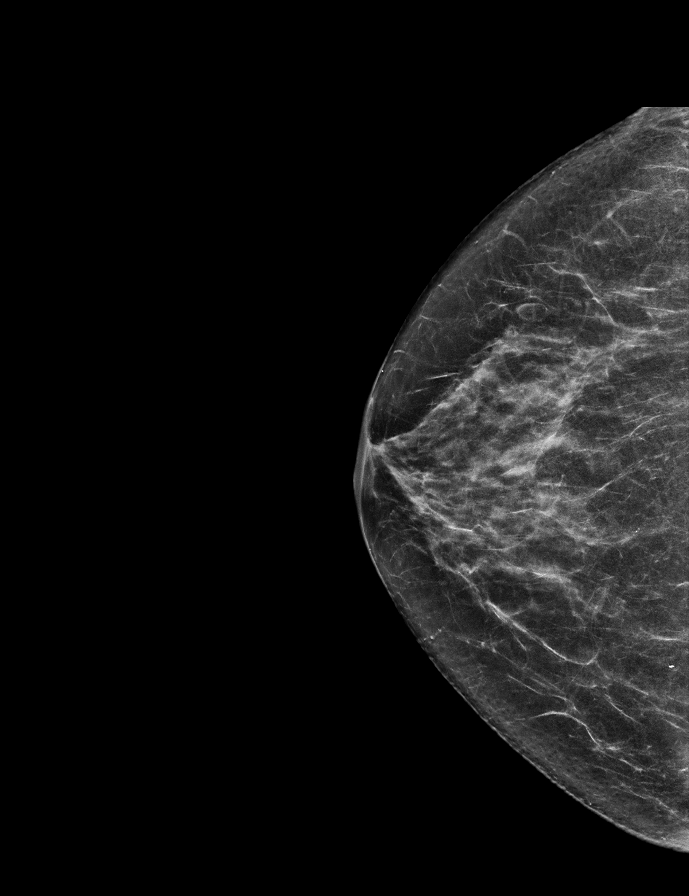

[L CC synth-2D]
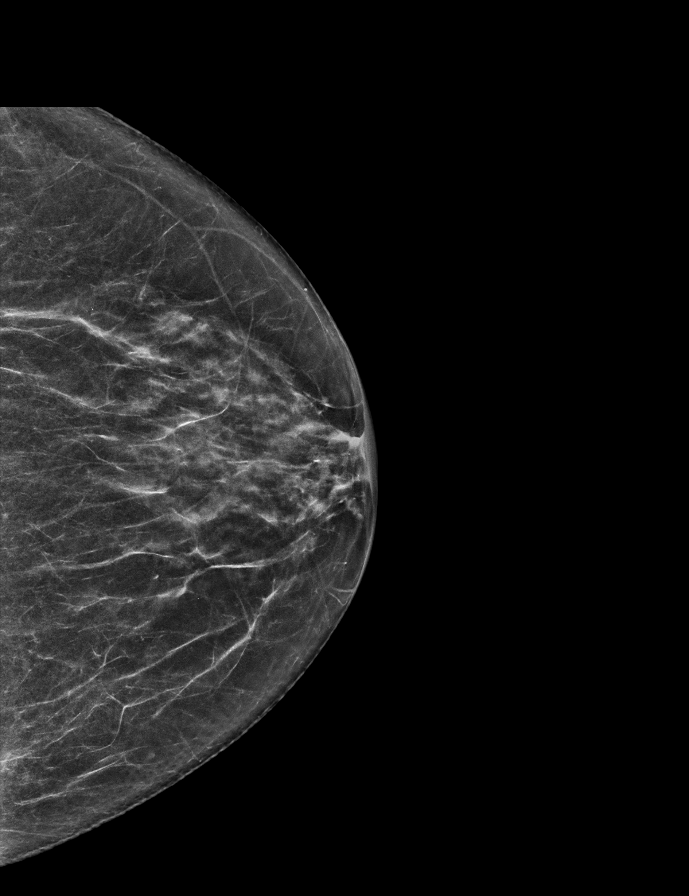

[R MLO synth-2D]
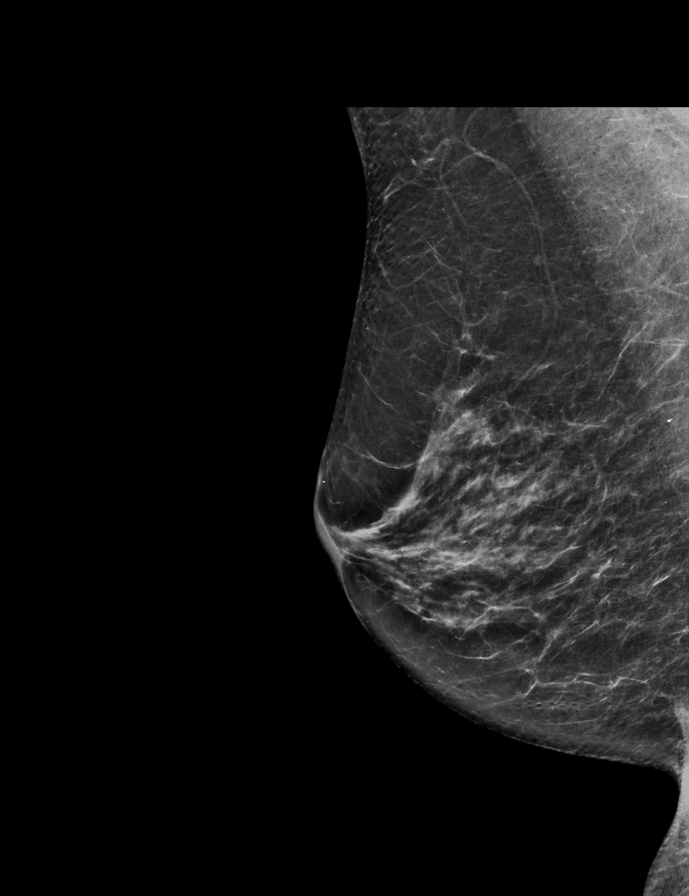

[L MLO synth-2D]
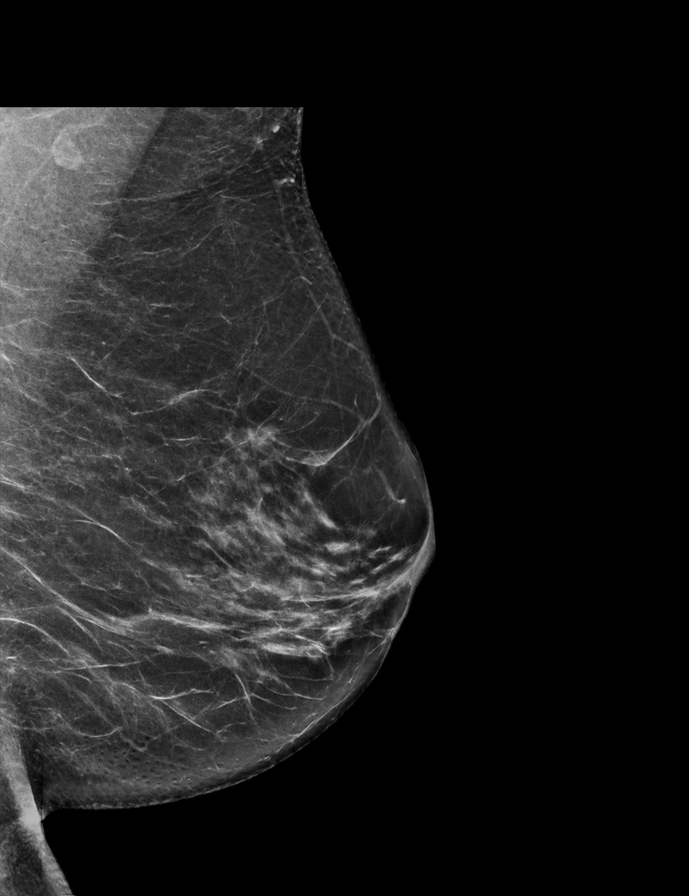

[L CC tomo · 2 of 58 frames shown]
[frame 19/58]
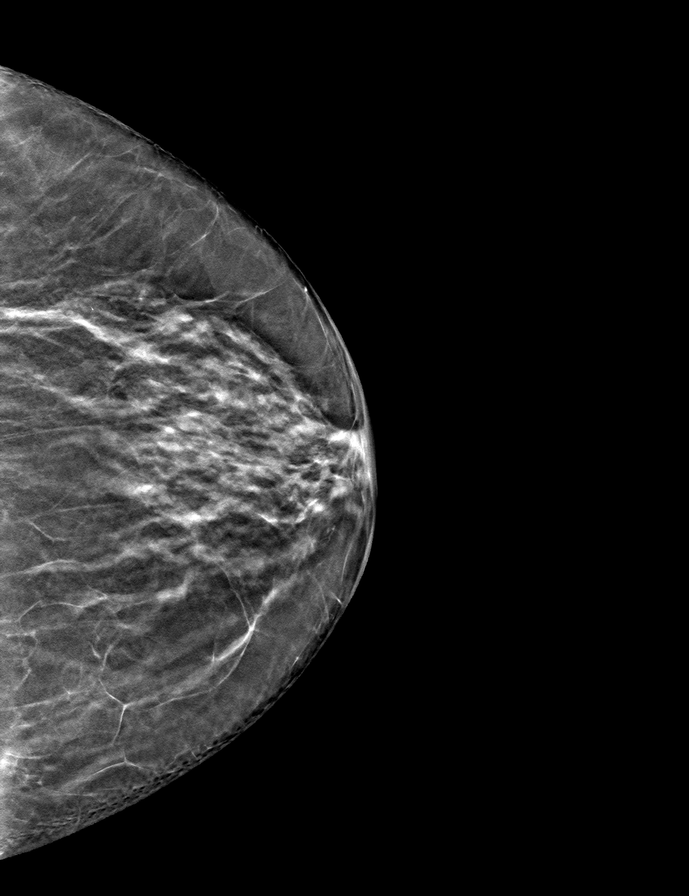
[frame 29/58]
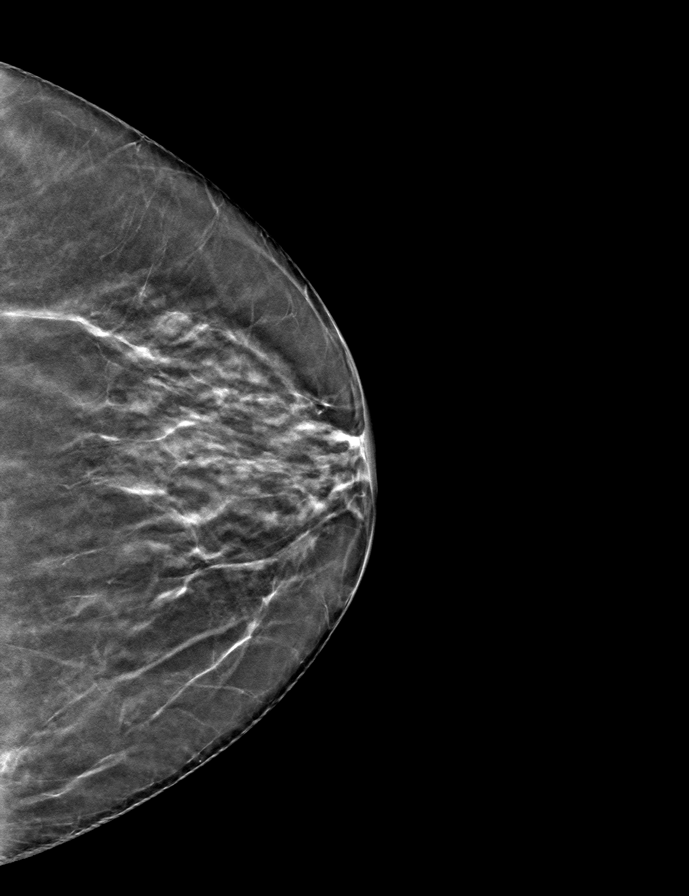

[R CC tomo · tomo slice 31/60.0]
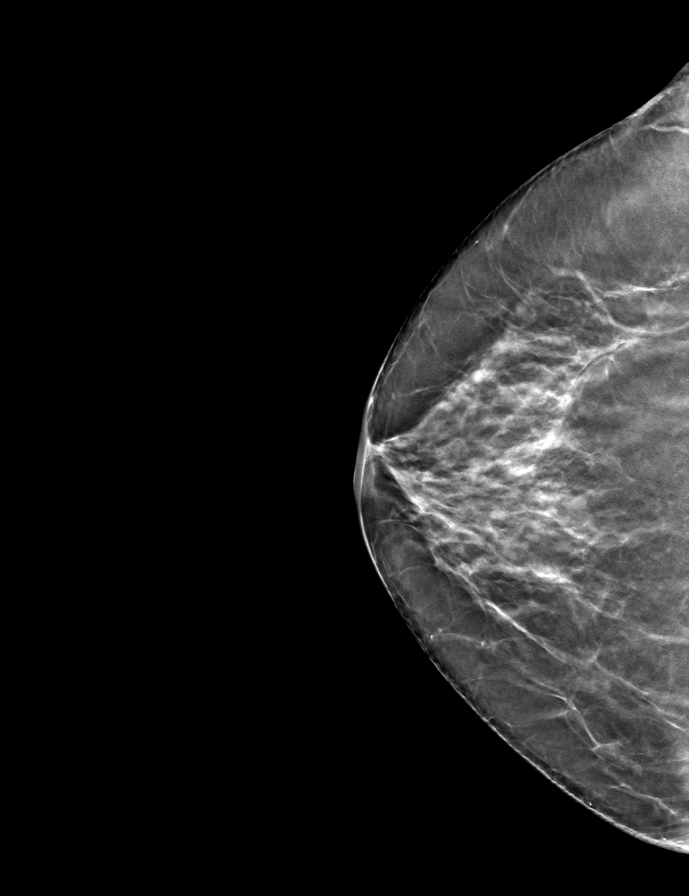

[R MLO tomo · tomo slice 33/64.0]
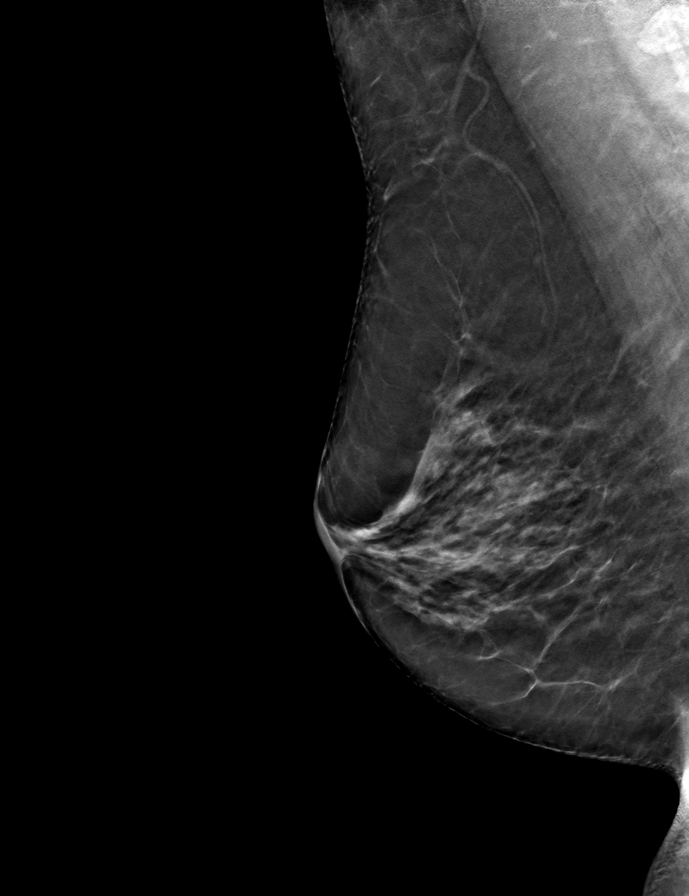

[L MLO tomo · tomo slice 35/68.0]
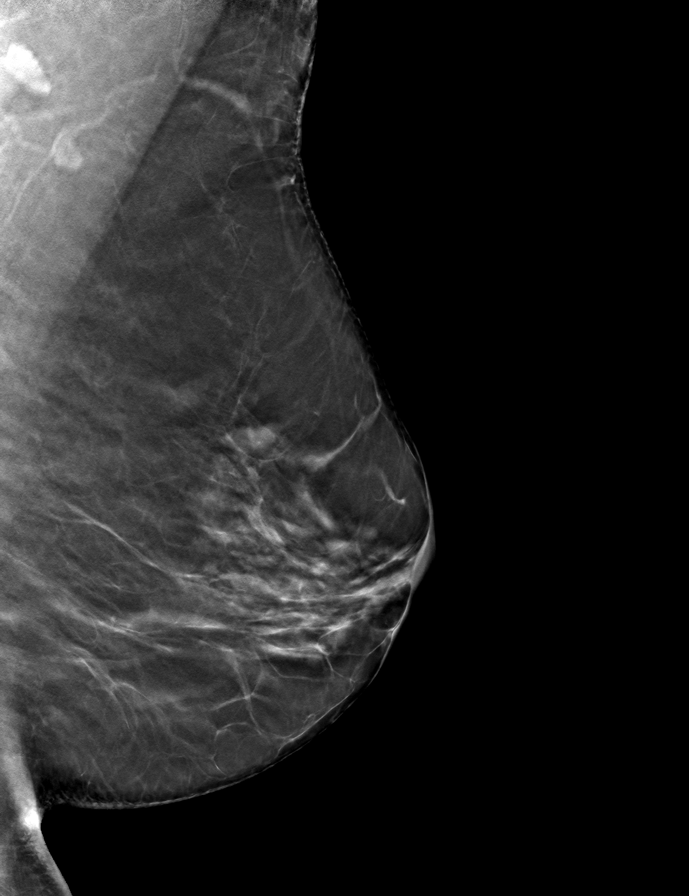

[9 of 24 positions shown; findings below may reference images not displayed]

ACR Breast Density Category c: The breast tissue is heterogeneously
dense, which may obscure small masses.
FINDINGS: There are no findings suspicious for malignancy.
IMPRESSION: No mammographic evidence of malignancy. A result letter of this
screening mammogram will be mailed directly to the patient.

RECOMMENDATION:
Screening mammogram in one year. (Code:Q3-W-BC3)

BI-RADS CATEGORY  1: Negative.

## 2023-05-26 ENCOUNTER — Other Ambulatory Visit: Payer: Self-pay | Admitting: Nurse Practitioner

## 2023-05-26 ENCOUNTER — Encounter: Payer: Self-pay | Admitting: Family Medicine

## 2023-05-26 DIAGNOSIS — Z1231 Encounter for screening mammogram for malignant neoplasm of breast: Secondary | ICD-10-CM

## 2023-06-19 ENCOUNTER — Ambulatory Visit: Payer: 59

## 2023-07-03 ENCOUNTER — Ambulatory Visit
Admission: RE | Admit: 2023-07-03 | Discharge: 2023-07-03 | Disposition: A | Discharge status: Home / Self Care | Payer: 59 | Source: Ambulatory Visit | Attending: Nurse Practitioner | Admitting: Nurse Practitioner

## 2023-07-03 DIAGNOSIS — Z1231 Encounter for screening mammogram for malignant neoplasm of breast: Secondary | ICD-10-CM

## 2023-07-07 ENCOUNTER — Ambulatory Visit: Payer: 59 | Admitting: Nurse Practitioner

## 2024-06-15 ENCOUNTER — Other Ambulatory Visit: Payer: Self-pay | Admitting: Nurse Practitioner

## 2024-06-15 DIAGNOSIS — Z0000 Encounter for general adult medical examination without abnormal findings: Secondary | ICD-10-CM

## 2024-07-19 ENCOUNTER — Ambulatory Visit
Admission: RE | Admit: 2024-07-19 | Discharge: 2024-07-19 | Disposition: A | Discharge status: Home / Self Care | Source: Ambulatory Visit | Attending: Nurse Practitioner | Admitting: Nurse Practitioner

## 2024-07-19 DIAGNOSIS — Z0000 Encounter for general adult medical examination without abnormal findings: Secondary | ICD-10-CM
